# Patient Record
Sex: Male | Born: 1955 | Race: White | Hispanic: No | Marital: Married | Smoking: Never smoker
Health system: Southern US, Community
[De-identification: ages and names within clinical notes are randomized; demographics above are authoritative.]

## PROBLEM LIST (undated history)

## (undated) DIAGNOSIS — D689 Coagulation defect, unspecified: Secondary | ICD-10-CM

## (undated) DIAGNOSIS — I82409 Acute embolism and thrombosis of unspecified deep veins of unspecified lower extremity: Secondary | ICD-10-CM

## (undated) DIAGNOSIS — D6851 Activated protein C resistance: Secondary | ICD-10-CM

## (undated) DIAGNOSIS — K2210 Ulcer of esophagus without bleeding: Secondary | ICD-10-CM

## (undated) HISTORY — DX: Ulcer of esophagus without bleeding: K22.10

## (undated) HISTORY — DX: Activated protein C resistance: D68.51

## (undated) HISTORY — DX: Coagulation defect, unspecified: D68.9

## (undated) HISTORY — DX: Acute embolism and thrombosis of unspecified deep veins of unspecified lower extremity: I82.409

---

## 1979-08-13 HISTORY — PX: NASAL SINUS SURGERY: SHX719

## 2000-03-27 ENCOUNTER — Ambulatory Visit (HOSPITAL_BASED_OUTPATIENT_CLINIC_OR_DEPARTMENT_OTHER): Admission: RE | Admit: 2000-03-27 | Discharge: 2000-03-27 | Payer: Self-pay | Admitting: Urology

## 2004-06-02 ENCOUNTER — Encounter: Admission: RE | Admit: 2004-06-02 | Discharge: 2004-06-02 | Payer: Self-pay | Admitting: Specialist

## 2007-09-12 DIAGNOSIS — K2210 Ulcer of esophagus without bleeding: Secondary | ICD-10-CM

## 2007-09-12 DIAGNOSIS — D689 Coagulation defect, unspecified: Secondary | ICD-10-CM

## 2007-09-12 HISTORY — DX: Ulcer of esophagus without bleeding: K22.10

## 2007-09-12 HISTORY — DX: Coagulation defect, unspecified: D68.9

## 2007-10-01 ENCOUNTER — Emergency Department (HOSPITAL_COMMUNITY): Admission: EM | Admit: 2007-10-01 | Discharge: 2007-10-01 | Payer: Self-pay | Admitting: Emergency Medicine

## 2007-10-04 ENCOUNTER — Encounter (INDEPENDENT_AMBULATORY_CARE_PROVIDER_SITE_OTHER): Payer: Self-pay | Admitting: Internal Medicine

## 2007-10-05 ENCOUNTER — Inpatient Hospital Stay (HOSPITAL_COMMUNITY): Admission: EM | Admit: 2007-10-05 | Discharge: 2007-10-08 | Payer: Self-pay | Admitting: Emergency Medicine

## 2007-10-05 ENCOUNTER — Ambulatory Visit: Payer: Self-pay | Admitting: Internal Medicine

## 2007-10-05 ENCOUNTER — Encounter: Admission: RE | Admit: 2007-10-05 | Discharge: 2007-10-05 | Payer: Self-pay | Admitting: Specialist

## 2007-10-09 ENCOUNTER — Ambulatory Visit: Payer: Self-pay | Admitting: Hospitalist

## 2007-10-09 ENCOUNTER — Encounter (INDEPENDENT_AMBULATORY_CARE_PROVIDER_SITE_OTHER): Payer: Self-pay | Admitting: *Deleted

## 2007-10-09 DIAGNOSIS — I82409 Acute embolism and thrombosis of unspecified deep veins of unspecified lower extremity: Secondary | ICD-10-CM | POA: Insufficient documentation

## 2007-10-09 LAB — CONVERTED CEMR LAB
Basophils Relative: 1 % (ref 0–1)
Eosinophils Absolute: 0.3 10*3/uL (ref 0.0–0.7)
Eosinophils Relative: 5 % (ref 0–5)
HCT: 45.2 % (ref 39.0–52.0)
Lymphs Abs: 1.1 10*3/uL (ref 0.7–3.3)
MCHC: 33.8 g/dL (ref 30.0–36.0)
MCV: 88.7 fL (ref 78.0–100.0)
Monocytes Relative: 7 % (ref 3–11)
Neutrophils Relative %: 68 % (ref 43–77)
RBC: 5.1 M/uL (ref 4.22–5.81)
WBC: 5.7 10*3/uL (ref 4.0–10.5)

## 2007-10-12 ENCOUNTER — Ambulatory Visit: Payer: Self-pay | Admitting: Internal Medicine

## 2007-10-12 LAB — CONVERTED CEMR LAB: INR: 2.5

## 2007-10-18 ENCOUNTER — Ambulatory Visit: Payer: Self-pay | Admitting: *Deleted

## 2007-10-22 ENCOUNTER — Ambulatory Visit: Payer: Self-pay | Admitting: Internal Medicine

## 2007-10-22 DIAGNOSIS — K2210 Ulcer of esophagus without bleeding: Secondary | ICD-10-CM

## 2007-10-22 LAB — CONVERTED CEMR LAB: INR: 4.5

## 2007-10-29 ENCOUNTER — Ambulatory Visit: Payer: Self-pay | Admitting: *Deleted

## 2007-10-29 ENCOUNTER — Encounter: Payer: Self-pay | Admitting: Pharmacist

## 2007-10-29 LAB — CONVERTED CEMR LAB: INR: 4

## 2007-11-05 ENCOUNTER — Ambulatory Visit: Payer: Self-pay | Admitting: Internal Medicine

## 2007-11-05 LAB — CONVERTED CEMR LAB: INR: 2

## 2007-11-12 ENCOUNTER — Ambulatory Visit: Payer: Self-pay | Admitting: Hospitalist

## 2007-11-26 ENCOUNTER — Ambulatory Visit: Payer: Self-pay | Admitting: Internal Medicine

## 2007-12-11 ENCOUNTER — Ambulatory Visit: Payer: Self-pay | Admitting: Hospitalist

## 2007-12-11 LAB — CONVERTED CEMR LAB: INR: 5.2

## 2007-12-17 ENCOUNTER — Ambulatory Visit: Payer: Self-pay | Admitting: Internal Medicine

## 2007-12-17 LAB — CONVERTED CEMR LAB: INR: 2.5

## 2008-01-01 ENCOUNTER — Ambulatory Visit: Payer: Self-pay | Admitting: Internal Medicine

## 2008-01-01 LAB — CONVERTED CEMR LAB: INR: 2.7

## 2008-01-14 ENCOUNTER — Ambulatory Visit: Payer: Self-pay | Admitting: Hospitalist

## 2008-01-14 LAB — CONVERTED CEMR LAB

## 2008-02-25 ENCOUNTER — Ambulatory Visit: Payer: Self-pay | Admitting: Hospitalist

## 2008-02-25 LAB — CONVERTED CEMR LAB: INR: 2.8

## 2008-03-31 ENCOUNTER — Ambulatory Visit: Payer: Self-pay | Admitting: Hospitalist

## 2008-04-19 ENCOUNTER — Emergency Department (HOSPITAL_COMMUNITY): Admission: EM | Admit: 2008-04-19 | Discharge: 2008-04-19 | Payer: Self-pay | Admitting: Emergency Medicine

## 2008-05-09 ENCOUNTER — Encounter (INDEPENDENT_AMBULATORY_CARE_PROVIDER_SITE_OTHER): Payer: Self-pay | Admitting: *Deleted

## 2008-05-09 ENCOUNTER — Ambulatory Visit: Payer: Self-pay | Admitting: Vascular Surgery

## 2008-05-09 ENCOUNTER — Ambulatory Visit: Payer: Self-pay | Admitting: *Deleted

## 2008-05-09 ENCOUNTER — Ambulatory Visit (HOSPITAL_COMMUNITY): Admission: RE | Admit: 2008-05-09 | Discharge: 2008-05-09 | Payer: Self-pay | Admitting: *Deleted

## 2008-05-09 DIAGNOSIS — D6859 Other primary thrombophilia: Secondary | ICD-10-CM

## 2008-05-12 ENCOUNTER — Ambulatory Visit: Payer: Self-pay | Admitting: *Deleted

## 2008-05-12 LAB — CONVERTED CEMR LAB: INR: 2.1

## 2008-05-19 ENCOUNTER — Ambulatory Visit: Payer: Self-pay | Admitting: Internal Medicine

## 2008-05-19 LAB — CONVERTED CEMR LAB

## 2008-05-27 ENCOUNTER — Ambulatory Visit: Payer: Self-pay | Admitting: Internal Medicine

## 2008-05-27 LAB — CONVERTED CEMR LAB: INR: 6.1

## 2008-06-02 ENCOUNTER — Ambulatory Visit: Payer: Self-pay | Admitting: Internal Medicine

## 2008-06-17 ENCOUNTER — Ambulatory Visit: Payer: Self-pay | Admitting: *Deleted

## 2008-06-17 LAB — CONVERTED CEMR LAB

## 2008-07-07 ENCOUNTER — Ambulatory Visit: Payer: Self-pay | Admitting: Internal Medicine

## 2008-07-07 LAB — CONVERTED CEMR LAB: INR: 2.3

## 2008-08-04 ENCOUNTER — Ambulatory Visit: Payer: Self-pay | Admitting: Internal Medicine

## 2008-08-04 LAB — CONVERTED CEMR LAB: INR: 2.1

## 2008-09-01 ENCOUNTER — Ambulatory Visit: Payer: Self-pay | Admitting: Internal Medicine

## 2008-09-01 LAB — CONVERTED CEMR LAB: INR: 4.1

## 2008-10-06 ENCOUNTER — Ambulatory Visit: Payer: Self-pay | Admitting: Internal Medicine

## 2008-12-08 ENCOUNTER — Ambulatory Visit: Payer: Self-pay | Admitting: Infectious Diseases

## 2008-12-08 LAB — CONVERTED CEMR LAB: INR: 2.3

## 2009-02-10 ENCOUNTER — Ambulatory Visit: Payer: Self-pay | Admitting: Internal Medicine

## 2009-02-10 LAB — CONVERTED CEMR LAB

## 2009-03-09 ENCOUNTER — Ambulatory Visit: Payer: Self-pay | Admitting: Internal Medicine

## 2009-03-09 LAB — CONVERTED CEMR LAB: INR: 3.4

## 2009-03-20 ENCOUNTER — Telehealth: Payer: Self-pay | Admitting: Internal Medicine

## 2009-03-23 ENCOUNTER — Ambulatory Visit: Payer: Self-pay | Admitting: Internal Medicine

## 2009-03-23 LAB — CONVERTED CEMR LAB

## 2009-04-02 ENCOUNTER — Ambulatory Visit: Payer: Self-pay | Admitting: Internal Medicine

## 2009-04-03 ENCOUNTER — Encounter (INDEPENDENT_AMBULATORY_CARE_PROVIDER_SITE_OTHER): Payer: Self-pay | Admitting: Internal Medicine

## 2009-06-01 ENCOUNTER — Ambulatory Visit: Payer: Self-pay | Admitting: Internal Medicine

## 2009-06-01 LAB — CONVERTED CEMR LAB

## 2009-09-28 ENCOUNTER — Encounter: Payer: Self-pay | Admitting: Pharmacist

## 2009-09-28 ENCOUNTER — Ambulatory Visit: Payer: Self-pay | Admitting: Internal Medicine

## 2009-10-12 ENCOUNTER — Ambulatory Visit: Payer: Self-pay | Admitting: Internal Medicine

## 2009-10-12 LAB — CONVERTED CEMR LAB: INR: 2.9

## 2010-01-04 ENCOUNTER — Ambulatory Visit: Payer: Self-pay | Admitting: Internal Medicine

## 2010-01-04 LAB — CONVERTED CEMR LAB: INR: 1.9

## 2010-04-12 ENCOUNTER — Ambulatory Visit: Payer: Self-pay | Admitting: Internal Medicine

## 2010-04-12 ENCOUNTER — Encounter: Payer: Self-pay | Admitting: Pharmacist

## 2010-04-12 LAB — CONVERTED CEMR LAB: INR: 1.9

## 2010-07-13 ENCOUNTER — Encounter (INDEPENDENT_AMBULATORY_CARE_PROVIDER_SITE_OTHER): Payer: Self-pay | Admitting: Pharmacist

## 2010-07-13 ENCOUNTER — Ambulatory Visit: Payer: Self-pay | Admitting: Internal Medicine

## 2010-09-20 ENCOUNTER — Ambulatory Visit: Payer: Self-pay | Admitting: Internal Medicine

## 2010-11-24 ENCOUNTER — Ambulatory Visit: Payer: Self-pay | Admitting: Internal Medicine

## 2010-11-24 DIAGNOSIS — K219 Gastro-esophageal reflux disease without esophagitis: Secondary | ICD-10-CM | POA: Insufficient documentation

## 2010-11-24 LAB — CONVERTED CEMR LAB: INR: 4.2

## 2010-11-27 DIAGNOSIS — D6851 Activated protein C resistance: Secondary | ICD-10-CM | POA: Insufficient documentation

## 2010-11-27 DIAGNOSIS — K2210 Ulcer of esophagus without bleeding: Secondary | ICD-10-CM | POA: Insufficient documentation

## 2010-12-28 DIAGNOSIS — Z7901 Long term (current) use of anticoagulants: Secondary | ICD-10-CM | POA: Insufficient documentation

## 2010-12-28 DIAGNOSIS — D6859 Other primary thrombophilia: Secondary | ICD-10-CM

## 2010-12-28 DIAGNOSIS — I82409 Acute embolism and thrombosis of unspecified deep veins of unspecified lower extremity: Secondary | ICD-10-CM

## 2011-01-11 NOTE — Assessment & Plan Note (Signed)
Summary: COUM/SB.  Anticoagulant Therapy Managed by: Barbera Setters. Janie Morning  PharmD CACP OPC Attending: Coralee Pesa MD, Joen Laura  Indication 1: Deep vein thrombus Indication 2: Encounter for therapeutic drug monitoring  V58.83  Patient Assessment Reviewed by: Chancy Milroy PharmD  Apr 12, 2010 Medication review: verified warfarin dosage & schedule,verified previous prescription medications, verified doses & any changes, verified new medications, reviewed OTC medications, reviewed OTC health products-vitamins supplements etc Complications: none Dietary changes: none   Health status changes: none   Lifestyle changes: none   Recent/future hospitalizations: none   Recent/future procedures: none   Recent/future dental: none Patient Assessment Part 2:  Have you MISSED ANY DOSES or CHANGED TABLETS?  No missed Warfarin doses or changed tablets.  Have you had any BRUISING or BLEEDING ( nose or gum bleeds,blood in urine or stool)?  No reported bruising or bleeding in nose, gums, urine, stool.  Have you STARTED or STOPPED any MEDICATIONS, including OTC meds,herbals or supplements?  No other medications or herbal supplements were started or stopped.  Have you CHANGED your DIET, especially green vegetables,or ALCOHOL intake?  No changes in diet or alcohol intake.  Have you had any ILLNESSES or HOSPITALIZATIONS?  No reported illnesses or hospitalizations  Have you had any signs of CLOTTING?(chest discomfort,dizziness,shortness of breath,arms tingling,slurred speech,swelling or redness in leg)    No chest discomfort, dizziness, shortness of breath, tingling in arm, slurred speech, swelling, or redness in leg.     Treatment  Target INR: 2.0-3.0 INR: 1.9  Date: 04/12/2010 Regimen In:  27.5mg /week INR reflects regimen in: 1.9  New  Tablet strength: : 5mg  Regimen Out:     Sunday: 1 Tablet     Monday: 1 Tablet     Tuesday: 1 Tablet     Wednesday: 1/2 Tablet     Thursday: 1 Tablet      Friday: 1  Tablet     Saturday: 1 Tablet Total Weekly: 32.5mg/week mg  Next INR Due: 05/17/2010 Adjusted by: James B. Groce III PharmD CACP   Return to anticoagulation clinic:  05/17/2010 Time of next visit: 0845    Allergies: No Known Drug Allergies Prescriptions: WARFARIN SODIUM 5 MG TABS (WARFARIN SODIUM) Tablet Strength: 5mg Take as directed  #50 x 1   Entered by:   Jay Groce PharmD   Authorized by:   Wynne E Woodyear MD   Signed by:   Jay Groce PharmD on 04/12/2010   Method used:   Electronically to        [facility]  [address]  [id number] (retail)       [address]       [place], [place]  [address]       Ph: [phone] or [phone]       Fax: [phone]   RxID:   [id number]

## 2011-01-11 NOTE — Assessment & Plan Note (Signed)
Summary: 261/CFB  Anticoagulant Therapy Managed by: Barbera Setters. Janie Morning  PharmD CACP OPC Attending: Coralee Pesa MD, Levada Schilling Indication 1: Deep vein thrombus Indication 2: Encounter for therapeutic drug monitoring  V58.83  Patient Assessment Reviewed by: Chancy Milroy PharmD  September 20, 2010 Medication review: verified warfarin dosage & schedule,verified previous prescription medications, verified doses & any changes, verified new medications, reviewed OTC medications, reviewed OTC health products-vitamins supplements etc Complications: none Dietary changes: none   Health status changes: none   Lifestyle changes: none   Recent/future hospitalizations: none   Recent/future procedures: none   Recent/future dental: none Patient Assessment Part 2:  Have you MISSED ANY DOSES or CHANGED TABLETS?  No missed Warfarin doses or changed tablets.  Have you had any BRUISING or BLEEDING ( nose or gum bleeds,blood in urine or stool)?  No reported bruising or bleeding in nose, gums, urine, stool.  Have you STARTED or STOPPED any MEDICATIONS, including OTC meds,herbals or supplements?  No other medications or herbal supplements were started or stopped.  Have you CHANGED your DIET, especially green vegetables,or ALCOHOL intake?  No changes in diet or alcohol intake.  Have you had any ILLNESSES or HOSPITALIZATIONS?  No reported illnesses or hospitalizations  Have you had any signs of CLOTTING?(chest discomfort,dizziness,shortness of breath,arms tingling,slurred speech,swelling or redness in leg)    No chest discomfort, dizziness, shortness of breath, tingling in arm, slurred speech, swelling, or redness in leg.     Treatment  Target INR: 2.0-3.0 INR: 1.5  Date: 09/20/2010 Regimen In:  32.5mg /week INR reflects regimen in: 1.5  New  Tablet strength: : 5mg  Regimen Out:     Sunday: 1 Tablet     Monday: 1 & 1/2 Tablet     Tuesday: 1 Tablet     Wednesday: 1 Tablet     Thursday: 1 & 1/2 Tablet     Friday: 1 Tablet     Saturday: 1 Tablet Total Weekly: 40.0mg /week mg  Next INR Due: 10/04/2010 Adjusted by: Barbera Setters. Alexandria Lodge III PharmD CACP   Return to anticoagulation clinic:  10/04/2010 Time of next visit: 0915    Allergies: No Known Drug Allergies

## 2011-01-11 NOTE — Miscellaneous (Signed)
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 Counadin for life. I have ordered an INR and I will F/U the results. Will send note to Dr Alexandria Lodge to arrange F/U.  INR 2.7. No change to his coumadin regimen. 5 mg tab, one once daily except Wed when he takes 1/2 tab, total 32.5 weekly.   Orders: Added new Test order of T-Protime (in-house) [phone]) - Signed Observations: Added new observation of INR: 2.7  (07/13/2010 9:07)      Laboratory Results   Blood Tests   Date/Time Received: July 13, 2010 9:20 AM Date/Time Reported: Burke Keels  July 13, 2010 9:20 AM    INR: 2.7   (Normal Range: 0.88-1.12   Therap INR: 2.0-3.5)

## 2011-01-11 NOTE — Assessment & Plan Note (Signed)
Summary: COU/CH  Anticoagulant Therapy Managed by: Barbera Setters. Alexandria Lodge III  PharmD CACP OPC Attending: Rogelia Boga MD, Lanora Manis Indication 1: Deep vein thrombus Indication 2: Encounter for therapeutic drug monitoring  V58.83  Patient Assessment Reviewed by: Chancy Milroy PharmD  January 04, 2010 Medication review: verified warfarin dosage & schedule,verified previous prescription medications, verified doses & any changes, verified new medications, reviewed OTC medications, reviewed OTC health products-vitamins supplements etc Complications: none Dietary changes: none   Health status changes: none   Lifestyle changes: none   Recent/future hospitalizations: none   Recent/future procedures: none   Recent/future dental: none Patient Assessment Part 2:  Have you MISSED ANY DOSES or CHANGED TABLETS?  No missed Warfarin doses or changed tablets.  Have you had any BRUISING or BLEEDING ( nose or gum bleeds,blood in urine or stool)?  No reported bruising or bleeding in nose, gums, urine, stool.  Have you STARTED or STOPPED any MEDICATIONS, including OTC meds,herbals or supplements?  No other medications or herbal supplements were started or stopped.  Have you CHANGED your DIET, especially green vegetables,or ALCOHOL intake?  No changes in diet or alcohol intake.  Have you had any ILLNESSES or HOSPITALIZATIONS?  No reported illnesses or hospitalizations  Have you had any signs of CLOTTING?(chest discomfort,dizziness,shortness of breath,arms tingling,slurred speech,swelling or redness in leg)    No chest discomfort, dizziness, shortness of breath, tingling in arm, slurred speech, swelling, or redness in leg.     Treatment  Target INR: 2.0-3.0 INR: 1.9  Date: 01/04/2010 Regimen In:  25mg /wk INR reflects regimen in: 1.9  New  Tablet strength: : 5mg  Regimen Out:     Sunday: 1/2 Tablet     Monday: 1 Tablet     Tuesday: 1 Tablet     Wednesday: 1 Tablet     Thursday: 1/2 Tablet      Friday:  1 Tablet     Saturday: 1/2 Tablet Total Weekly: 27.5mg /week mg  Next INR Due: 02/01/2010 Adjusted by: Barbera Setters. Alexandria Lodge III PharmD CACP   Return to anticoagulation clinic:  02/01/2010 Time of next visit: 0900    Allergies: No Known Drug Allergies

## 2011-01-11 NOTE — Assessment & Plan Note (Signed)
Summary: [facility]in Clinic  Anticoagulant Therapy Managed by: Barbera Setters. Robert [name]  PharmD CACP OPC Attending: Coralee Pesa MD, Joen Laura  Indication 1: Deep vein thrombus Indication 2: Encounter for therapeutic drug monitoring  V58.83  Patient Assessment Reviewed by: Chancy Milroy PharmD  Apr 12, 2010 Medication review: verified warfarin dosage & schedule,verified previous prescription medications, verified doses & any changes, verified new medications, reviewed OTC medications, reviewed OTC health products-vitamins supplements etc Complications: none Dietary changes: none   Health status changes: none   Lifestyle changes: none   Recent/future hospitalizations: none   Recent/future procedures: none   Recent/future dental: none Patient Assessment Part 2:  Have you MISSED ANY DOSES or CHANGED TABLETS?  No missed Warfarin doses or changed tablets.  Have you had any BRUISING or BLEEDING ( nose or gum bleeds,blood in urine or stool)?  No reported bruising or bleeding in nose, gums, urine, stool.  Have you STARTED or STOPPED any MEDICATIONS, including OTC meds,herbals or supplements?  No other medications or herbal supplements were started or stopped.  Have you CHANGED your DIET, especially green vegetables,or ALCOHOL intake?  No changes in diet or alcohol intake.  Have you had any ILLNESSES or HOSPITALIZATIONS?  No reported illnesses or hospitalizations  Have you had any signs of CLOTTING?(chest discomfort,dizziness,shortness of breath,arms tingling,slurred speech,swelling or redness in leg)    No chest discomfort, dizziness, shortness of breath, tingling in arm, slurred speech, swelling, or redness in leg.     Treatment  Target INR: 2.0-3.0 INR: 1.9  Date: 04/12/2010 Regimen In:  32.5mg /week INR reflects regimen in: 1.9  New  Tablet strength: : 5mg  Regimen Out:     Sunday: 1 Tablet     Monday: 1 Tablet     Tuesday: 1 Tablet     Wednesday: 1/2 Tablet     Thursday: 1 Tablet  Friday: 1 Tablet     Saturday: 1 Tablet Total Weekly: 32.5mg /week mg  Next INR Due: 05/17/2010 Adjusted by: Barbera Setters. Alexandria Lodge III PharmD CACP   Return to anticoagulation clinic:  05/17/2010 Time of next visit: 0845    Allergies: No Known Drug Allergies

## 2011-01-13 NOTE — Assessment & Plan Note (Signed)
Summary: CHECKUP/NEED MEDICATION/SB.   Vital Signs:  Patient profile:   55 year old male Height:      75 inches (190.50 cm) Weight:      197.01 pounds (89.55 kg) BMI:     24.71 Temp:     97.2 degrees F (36.22 degrees C) oral Pulse rate:   66 / minute BP sitting:   117 / 66  (right arm)  Vitals Entered By: Angelina Ok RN (November 24, 2010 2:04 PM) Is Patient Diabetic? No Pain Assessment Patient in pain? no      Nutritional Status BMI of 19 -24 = normal  Have you ever been in a relationship where you felt threatened, hurt or afraid?No   Does patient need assistance? Functional Status Self care Ambulation Normal Comments Check up.   History of Present Illness: This is a 55 year old male with hx of GERD and factor 5 leiden sp deep vein thrombophlebitis and currently on coumadin  who presents for routine follow up.   Pt actually has plans to change his PCP over to Dr. Tenny Craw in the near future but just wanted to come in today for a refill of his coumadin.  Pts last blood clot was 2-3 years ago per him and he is on lifelong coumadin.  Pt is living a healthy lifestyle, is working out, and eating healthy.  Pt denies any GERD symptoms, CP, SOB, change in weight, fever or chills.    Depression History:      The patient denies a depressed mood most of the day and a diminished interest in his usual daily activities.         Preventive Screening-Counseling & Management  Alcohol-Tobacco     Alcohol drinks/day: 0     Smoking Status: never     Passive Smoke Exposure: no  Allergies: No Known Drug Allergies  Past History:  Past Medical History: Last updated: 05/09/2008 DVT left leg 10-08, Coumadin for 6 months Heterozygosity for factor V Leiden mutation Hx of esophageal ulcer 10-08  Family History: Reviewed history from 04/02/2009 and no changes required. mother with strokes in her 22's father with CABG in his 73's  Social History: Reviewed history from 04/02/2009 and no  changes required. 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   Physical Exam  General:  alert and well-developed.   Head:  normocephalic and atraumatic.   Eyes:  vision grossly intact, pupils equal, pupils round, and pupils reactive to light.   Nose:  no nasal discharge.   Mouth:  pharynx pink and moist.   Lungs:  normal respiratory effort, normal breath sounds, no crackles, and no wheezes.   Heart:  normal rate, regular rhythm, no murmur, no gallop, and no rub.   Abdomen:  soft, non-tender, and normal bowel sounds.   Msk:  normal ROM.   Pulses:  2+ dp/pt pulses.  Extremities:  No edema.    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 Per Dr. Claris Che last note, pt is heterozygous for factor 5 Leiden deficiency. I will refill pts coumadin today.  Pts INR today was 4.2 as a result I asked that he hold today's dose and resume his normal dose tomorrow.  I have requested that Dr. Alexandria Lodge provide further recommendations if needed.  Pt currently denies any unusual or easy bleeding.  I  did inform the pt that he needed to discuss changing PCP's with Dr. Alexandria Lodge to figure out who will be managing pts coumadin in the future.  Orders: T-Protime (in-house) [phone])  Problem # 2:  GERD (ICD-530.81) Pt denies any GERD symptoms and has been taking his nexium regularly.   His updated medication list for this problem includes:    Nexium 40 Mg Cpdr (Esomeprazole magnesium) .Marland Kitchen... Take 1 capsule by mouth once a day  Problem # 3:  Preventive Health Care (ICD-V70.0) Pt up to date on his flu shot, tetnus shot and colonoscopy.  Pt still needs a FLP but wants Dr. Tenny Craw to do it.  We will transfer the patients records at his future request.   Complete Medication List: 1)  Nexium 40 Mg Cpdr (Esomeprazole magnesium) .... Take 1 capsule by mouth once a  day 2)  Warfarin Sodium 5 Mg Tabs (Warfarin sodium) .... Tablet strength: 5mg  take as directed 3)  Zyrtec Allergy 10 Mg Tabs (Cetirizine hcl)  Patient Instructions: 1)  Please tell your new doctor that you need a fasting lipid panel. Please discuss changing your primary doctor with Dr. Alexandria Lodge to make sure he will still be able to manage your coumadin.  Prescriptions: WARFARIN SODIUM 5 MG TABS (WARFARIN SODIUM) Tablet Strength: 5mg  Take as directed  #100 Tablet x 2   Entered and Authorized by:   Sinda Du MD   Signed by:   Sinda Du MD on 11/24/2010   Method used:   Print then Give to Patient   RxID:   [id number]    Orders Added: 1)  Est. Patient Level III [62130] 2)  T-Protime (in-house) [86578IO]     Vital Signs:  Patient profile:   55 year old male Height:      75 inches (190.50 cm) Weight:      197.01 pounds (89.55 kg) BMI:     24.71 Temp:     97.2 degrees F (36.22 degrees C) oral Pulse rate:   66 / minute BP sitting:   117 / 66  (right arm)  Vitals Entered By: Angelina Ok RN (November 24, 2010 2:04 PM)   Prevention & Chronic Care Immunizations   Influenza vaccine: Not documented    Tetanus booster: Not documented    Pneumococcal vaccine: Not documented  Colorectal Screening   Hemoccult: Not documented    Colonoscopy: Not documented  Other Screening   PSA: Not documented   Smoking status: never  (11/24/2010)  Lipids   Total Cholesterol: Not documented   LDL: Not documented   LDL Direct: Not documented   HDL: Not documented   Triglycerides: Not documented  Laboratory Results   Blood Tests   Date/Time Received: November 24, 2010 4:41 PM Date/Time Reported: Alric Quan  November 24, 2010 4:41 PM    INR: 4.2   (Normal Range: 0.88-1.12   Therap INR: 2.0-3.5) Comments: Results reported to Dr Cathey Endow by Levy Sjogren, PBT 11-24-10 $ 1643p  Alric Quan  November 24, 2010 4:44 PM

## 2011-02-05 ENCOUNTER — Observation Stay (HOSPITAL_COMMUNITY)
Admission: EM | Admit: 2011-02-05 | Discharge: 2011-02-06 | Disposition: A | Discharge status: Home / Self Care | Payer: 59 | Attending: Family Medicine | Admitting: Family Medicine

## 2011-02-05 ENCOUNTER — Emergency Department (HOSPITAL_COMMUNITY): Payer: 59

## 2011-02-05 DIAGNOSIS — Z86718 Personal history of other venous thrombosis and embolism: Secondary | ICD-10-CM | POA: Insufficient documentation

## 2011-02-05 DIAGNOSIS — M7981 Nontraumatic hematoma of soft tissue: Principal | ICD-10-CM | POA: Insufficient documentation

## 2011-02-05 DIAGNOSIS — Z7901 Long term (current) use of anticoagulants: Secondary | ICD-10-CM | POA: Insufficient documentation

## 2011-02-05 DIAGNOSIS — T45515A Adverse effect of anticoagulants, initial encounter: Secondary | ICD-10-CM | POA: Insufficient documentation

## 2011-02-05 DIAGNOSIS — R791 Abnormal coagulation profile: Secondary | ICD-10-CM | POA: Insufficient documentation

## 2011-02-05 DIAGNOSIS — R252 Cramp and spasm: Secondary | ICD-10-CM | POA: Insufficient documentation

## 2011-02-05 DIAGNOSIS — K219 Gastro-esophageal reflux disease without esophagitis: Secondary | ICD-10-CM | POA: Insufficient documentation

## 2011-02-05 LAB — TYPE AND SCREEN
ABO/RH(D): O POS
Antibody Screen: NEGATIVE

## 2011-02-05 LAB — POCT I-STAT, CHEM 8
BUN: 24 mg/dL — ABNORMAL HIGH (ref 6–23)
Chloride: 102 mEq/L (ref 96–112)
Sodium: 138 mEq/L (ref 135–145)

## 2011-02-05 LAB — CBC
HCT: 42 % (ref 39.0–52.0)
Hemoglobin: 14.4 g/dL (ref 13.0–17.0)
MCH: 29.7 pg (ref 26.0–34.0)
MCHC: 34.3 g/dL (ref 30.0–36.0)

## 2011-02-05 LAB — DIFFERENTIAL
Lymphocytes Relative: 20 % (ref 12–46)
Lymphs Abs: 1.6 10*3/uL (ref 0.7–4.0)
Monocytes Relative: 7 % (ref 3–12)
Neutro Abs: 5.8 10*3/uL (ref 1.7–7.7)
Neutrophils Relative %: 72 % (ref 43–77)

## 2011-02-05 LAB — PROTIME-INR: INR: 1.96 — ABNORMAL HIGH (ref 0.00–1.49)

## 2011-02-06 LAB — BASIC METABOLIC PANEL
BUN: 11 mg/dL (ref 6–23)
CO2: 28 mEq/L (ref 19–32)
Calcium: 8.9 mg/dL (ref 8.4–10.5)
Glucose, Bld: 99 mg/dL (ref 70–99)
Sodium: 141 mEq/L (ref 135–145)

## 2011-02-06 LAB — PREPARE FRESH FROZEN PLASMA: Unit division: 0

## 2011-02-06 LAB — CBC
HCT: 40.9 % (ref 39.0–52.0)
Hemoglobin: 13.6 g/dL (ref 13.0–17.0)
MCH: 29.2 pg (ref 26.0–34.0)
MCHC: 33.3 g/dL (ref 30.0–36.0)
MCV: 87.8 fL (ref 78.0–100.0)
Platelets: 139 10*3/uL — ABNORMAL LOW (ref 150–400)
RBC: 4.66 MIL/uL (ref 4.22–5.81)
RDW: 14.3 % (ref 11.5–15.5)
WBC: 6.8 10*3/uL (ref 4.0–10.5)

## 2011-02-14 NOTE — H&P (Signed)
Robert [name], Robert [name]               ACCOUNT NO.:  [id number]  MEDICAL RECORD NO.:  [id number]           PATIENT TYPE:  E  LOCATION:  [facility]                         FACILITY:  [facility]  PHYSICIAN:  Robert [name], M.D. DATE OF BIRTH:  [birth year]  DATE OF ADMISSION:  02/05/2011 DATE OF DISCHARGE:                             HISTORY & PHYSICAL   PRIMARY CARE PHYSICIAN:  Dr. Dorma Russell  CHIEF COMPLAINT:  Increased pain and swelling, left thigh.  HISTORY OF PRESENT ILLNESS:  This is a 55 year old male who is on chronic Coumadin therapy secondary to recurrent deep venous thrombosis who presents to the Emergency Department with complaints of increased pain and swelling of the left thigh area.  The onset occurred in the afternoon on the day prior to admission.  The patient contacted his primary care physician who was concerned about bleeding through the thigh.  The patient denied having any trauma.  He does exercise and do jujitsu, but reports no contact or trauma.  He presented to the Emergency Department and was evaluated.  His Coumadin level was found to be mildly supratherapeutic with an INR of 3.48.  He had the CT scan of the left hip and thigh performed, which revealed expansion of the deep anterior muscle groups in the left thigh encompassing an area of 8 x 16 x 6, low density areas within the irregularly-shaped expansion consistent with a hematoma if the history of the patient was coagulopathic.  There had been a history of trauma.  The Emergency Department physician contacted orthopedics for a consultation to evaluate for possible compartment syndrome and the consultants opinion was that this was not a compartment syndrome.  Advice was given to reverse the patient's Coumadin level of which the Emergency Department physician began FFP and administered IV vitamin K x1 dose.  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  PAST SURGICAL HISTORY:  History of a sinus surgery.  MEDICATIONS:  Coumadin and hydrocodone/APAP.  SOCIAL HISTORY:  The patient is a nonsmoker, nondrinker.  No history of illicit drug usage.  FAMILY HISTORY:  Negative for coagulopathic disorders.  REVIEW OF SYSTEMS:  The patient denies having any fever, chills, nausea, vomiting, diarrhea, hematemesis, hematochezia, or melena passage. Denies having any bleeding gums.  The patient denies having any lightheadedness, syncope, weakness, or fatigue.  He denies having any changes in his sleep habits.  Denies having any other muscular pain other than at the site of the hematoma.  PHYSICAL EXAMINATION FINDINGS:  GENERAL:  This is a well-nourished, well- developed 55 year old Caucasian male who is in discomfort, but no acute distress. VITAL SIGNS:  Temperature 97.9, blood pressure 132/86, heart rate 65, respirations 14, and O2 sats 100%. HEENT:  Normocephalic, atraumatic.  Pupils are equally round, reactive to light.  Extraocular movements are intact.  Funduscopic benign.  There is no scleral icterus.  Nares are patent bilaterally.  Oropharynx is clear. NECK:  Supple.  Full range of motion.  No thyromegaly, adenopathy, or jugular venous distention. CARDIOVASCULAR:  Regular rate and rhythm.  No murmurs, gallops,  or rubs appreciated. LUNGS:  Clear to auscultation bilaterally.  No rales, rhonchi, or wheezes. ABDOMEN:  Positive bowel sounds.  Soft, nontender, nondistended.  No hepatosplenomegaly. EXTREMITIES:  Without cyanosis, clubbing, or edema. NEUROLOGIC:  Nonfocal.  LABORATORY DATA:  White blood cell count 8.1, hemoglobin 14.4, hematocrit 42.0, MCV 86.6, platelets 158, neutrophils 72%, and lymphocytes 20%.  Sodium 138, potassium 3.8, chloride 102, CO2 of 24, BUN 24, creatinine 1.2, and glucose 110.  Protime 35.0 and INR 3.48.  IMAGING DATA:  Results of the CT scan of the left hip and thigh  area as mentioned above in the HPI.  ASSESSMENT:  A 55 year old male being admitted with, 1. Coagulopathy secondary to Coumadin therapy with supratherapeutic     level. 2. Left thigh hematoma secondary coagulopathy. 3. History of recurrent deep venous thrombosis. 4. Left thigh pain.  PLAN:  The patient will be admitted and the patient has been administered vitamin K IV x1 dose and FFP x1 unit to reverse his Coumadin level.  His protime and INR will be monitored daily.  Pain control therapy will be initiated.  The orthopedic consultation has been performed and the written note has been placed in the chart.  The patient will be placed on pain control therapy as needed and the PT and INR will be monitored daily.     Robert [name], M.D.     HJ/MEDQ  D:  02/05/2011  T:  02/05/2011  Job:  [id number]  cc:   Dorma Russell  Electronically Signed by Robert [name] M.D. on 02/14/2011 08:23:51 PM

## 2011-02-24 NOTE — Discharge Summary (Signed)
  Robert [name], Robert [name]               ACCOUNT NO.:  [id number]  MEDICAL RECORD NO.:  [id number]           PATIENT TYPE:  I  LOCATION:  5032                         FACILITY:  [facility]  PHYSICIAN:  Pleas Koch, MD        DATE OF BIRTH:  [birth year]  DATE OF ADMISSION:  02/05/2011 DATE OF DISCHARGE:  02/06/2011                              DISCHARGE SUMMARY   PRIMARY CARE PHYSICIAN:  C. Duane Lope, MD  DISCHARGE DIAGNOSES: 1. Left thigh hematoma secondary to coagulopathy. 2. Supratherapeutic INR secondary to being on Coumadin therapy. 3. History of recurrent left deep venous thrombosis.  Discharge medications are as follows: 1. Coumadin 5 mg p.o. nightly. 2. Roxicodone 5 mg p.o. q.6 h. p.r.n. for 5 days, quantity sufficient     for that (20). 3. Nexium 40 mg one capsule every morning.  PERTINENT CONSULTS:  Dr. Malon Kindle of Orthopedics saw the patient.  PERTINENT IMAGING:  CT of the left hip area showed, 1. Abnormally expanded heterogeneous deep muscle group of the left     thigh encompassing an area of 8 x 16 x 6.  If the patient is     significantly coagulopathic or has traumatized this region,     hematoma is most likely.  No bony changes in the visualized left     lower extremity, visualized pelvic viscera unremarkable.   Dictation ended at this point.          ______________________________ Pleas Koch, MD     JS/MEDQ  D:  02/06/2011  T:  02/06/2011  Job:  [id number]  cc:   Almedia Balls. Ranell Patrick, M.D. Vianne Bulls, M.D.  Electronically Signed by Pleas Koch MD on 02/24/2011 04:36:40 PM

## 2011-02-24 NOTE — Discharge Summary (Signed)
Robert [name], Robert [name]               ACCOUNT NO.:  [id number]  MEDICAL RECORD NO.:  [id number]           PATIENT TYPE:  I  LOCATION:  5032                         FACILITY:  [facility]  PHYSICIAN:  [facility]      DATE OF BIRTH:  Oct 29, 1956  DATE OF ADMISSION:  02/05/2011 DATE OF DISCHARGE:  02/06/2011                              DISCHARGE SUMMARY   PRIMARY CARE PHYSICIAN:  Dr. Tenny Craw and Dr. Almedia Balls. Ranell Patrick, MD  DISCHARGE DIAGNOSES: 1. Left thigh hematoma. 2. Coagulopathy secondary to supratherapeutic INR. 3. 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 4. Reflux.  DISCHARGE MEDICATIONS: 1. Coumadin 5 mg 1 tablet daily at bedtime. 2. Nexium 40 mg 1 capsule every morning. 3. OxyIR 5 mg q.6 h. as needed, take 5 days.  The patient was given a     prescription for 20 tablets.  PERTINENT CONSULTS:  Almedia Balls. Ranell Patrick, MD, ortho  PERTINENT STUDIES: 1. CT of the left hip without contrast February 05, 2011, showed no     collection, but heterogenous deep muscle group left leg 8 x     16 x 6 cm.  The patient denies significant     traumatized to his recent physical acgtivity and hematomas, most likely. 2. No bony changes in the visualized left lower extremity and     visualized pelvic viscera unremarkable.  BRIEF HISTORY:  The patient is 55 year old male on chronic Coumadin therapy secondary to recurrent DVT, most recently in 2008, complaints of increased thigh swelling and pain.  This occurred  prior to admission.  He contacted PCP with concern about bleeding through the thigh.  The patient does exercise, but reports no contact to trauma.  He was found to be mildly supratherapeutic with an INR of 3.48 initially and CT as above.  Orthopedics saw the patient and Dr. Ranell Patrick was seen for possible compartment syndrome.  The patient's INR was reversed with FFP and 1 dose of vitamin K.  HOSPITAL COURSE:  The patient's INR trended down and the swelling trended down nicely while in  hospital.  His discharge INR was 1.27.  The patient was ambulant subsequent to this and on the day of discharge was actually ambulating well, tolerating full diet, and although he had pain, was able to get around.  I have instructed him to followup with his primary care physician Dr. Tenny Craw for further instructions and Coumadin dosing in the next one to next 4-5 days.  He is to restart his Coumadin that has been held overnight while he was here in the hospital at his regular home dosage.  Labs done showed slight drop in hemoglobin 14.6 and 13.6; however, I do not deem this as something that is detrimental to him at this point in time.  On the day of discharge, discharge vitals 98, pulse is 65, blood pressure is 112-127/61-73, and he is satting 95% on room air.    ______________________________ Pleas Koch, MD   ______________________________ [facility]    JS/MEDQ  D:  02/06/2011  T:  02/06/2011  Job:  [id number]  cc:   Dr. Baruch Gouty R. Ranell Patrick, M.D.  Electronically Signed by Pleas Koch MD on 02/24/2011 04:36:30 PM

## 2011-02-26 NOTE — Consult Note (Signed)
NAMERAMESES, OU               ACCOUNT NO.:  [id number]  MEDICAL RECORD NO.:  [id number]           PATIENT TYPE:  I  LOCATION:  5032                         FACILITY:  [facility]  PHYSICIAN:  Almedia Balls. Ranell Patrick, M.D. DATE OF BIRTH:  Apr 11, 1956  DATE OF CONSULTATION:  02/05/2011 DATE OF DISCHARGE:  02/06/2011                                CONSULTATION   REASON FOR CONSULTATION:  Rule out compartment syndrome.  HISTORY OF PRESENT ILLNESS:  The patient is a 55 year old male with a history of prior deep venous thrombosis x2, currently anticoagulated who has a several hour history of increasing left proximal thigh swelling and pain.  The patient reports normally being followed for his INR fairly closely, around 2.5 is his INR level.  The patient was recently seen in Dr. Charlott Rakes office yesterday, February 04, 2011 and had an INR level of 4.1.  The patient does a lot of Jiu-Jitsu, a martial arts which is primarily grabbling and wrestling and he was doing some of that but denied any injury prior to thigh beginning to swell.  The patient reported about a 4-hour time difference between the time that he finished his Jiu-Jitsu work out and began having thigh swelling.  The patient is in quite a bit of pain now and was asked by Dr. Randa Evens in the emergency department to evaluate to rule out compartment syndrome.  PAST HISTORY:  DVT x2 in the left lower extremity, the last one in 2008 with chronic anticoagulation followed by Dr. Charlott Rakes office.  Other past medical history is for esophageal ulcer and allergies.  MEDICATIONS:  Coumadin.  PHYSICAL EXAMINATION:  GENERAL:  The patient is a healthy appearing male who is in moderate to severe distress, alert and oriented. EXTREMITIES:  Examination of the left lower extremity reveals a focal area of swelling in the left proximal thigh, it is very tender to palpation.  Compartment was compressible and fairly soft.  There is no pain with palpation distal  in the compartment which is primarily in the anterior compartment of the thigh.  He has limited ability to extend his knee secondary to pain and we did not really do any flexion of the knee but the knee itself was not swollen, not red.  There was no posterior tenderness.  He has 2+ pulses distally at the posterior tib pulses and sensation is normal distally and the leg appears well perfused.  Radiographs are negative.  IMPRESSION:  Left thigh bleed secondary to supratherapeutic Coumadin level.  PLAN:  Discussed with Mr. Rowe that definitely we are going to minutes recommend trying to get his INR level back down to around 2.5 and I am sure this is what is responsible for the bleeding in the thigh. Certainly knows he is at risk doing a contact sports such as Jiu-Jitsu for generating bleeding in areas including joints, hand, and muscle areas.  At this point would recommend mechanical DVT prophylaxis, reversal or correction of INR to 2.5. Supportive care with pain medications and bed rest until swelling stops and INR normalizes and we are happy to come back and reevaluate the patient should there be  any more concern for compartment syndrome but right now there are no indications that he has compartment syndrome and simply has a localized swelling to the thigh.     Almedia Balls. Ranell Patrick, M.D.     SRN/MEDQ  D:  02/10/2011  T:  02/11/2011  Job:  [id number]  Electronically Signed by Malon Kindle  on 02/26/2011 11:22:11 AM

## 2011-04-26 NOTE — Discharge Summary (Signed)
Robert [name], Robert [name]               ACCOUNT NO.:  [id number]   MEDICAL RECORD NO.:  [id number]          PATIENT TYPE:  INP   LOCATION:  5734                         FACILITY:  [facility]   PHYSICIAN:  Madaline Guthrie, M.D.    DATE OF BIRTH:  Dec 26, 1955   DATE OF ADMISSION:  10/05/2007  DATE OF DISCHARGE:  10/06/2007                               DISCHARGE SUMMARY   DISCHARGE DIAGNOSES:  1. New deep venous thrombosis.  2. Esophageal ulcer diagnosed by Dr. Loreta Ave as an outpatient on October 03, 2007.  3. Previous sinus surgery.  4. Allergic rhinitis.   DISCHARGE MEDICATIONS:  1. Nexium 40 mg daily.  2. Sucralfate 4 times a day.  3. Lovenox 80 mg subcutaneous twice a day.  4. Coumadin 5 mg daily.   DISPOSITION AT DISCHARGE:  The patient was shown how to inject himself  with the Lovenox and will continue on Coumadin 5 mg daily until he can  come and get his INR checked in the outpatient clinic where he will try  to establish primary care.  He was advised to come to the outpatient  clinic on Tuesday, October 09, 2007, for an INR check and the results  will be called to Dr. Dellia Beckwith for Coumadin adjustment.  He will  also be scheduled for a visit with a physician after the weekend when  the clinic opens and will be called with that appointment.   At the time of discharge, a hypercoagulable panel was still pending and  needs to be followed as an outpatient and, at his follow up in the  clinic, we also need to get records from Dr. Loreta Ave to see if she did a  biopsy of that esophageal ulcer to see if this malignancy or not.   PROCEDURES:  Left leg venous Doppler performed at [facility]  earlier in the day showed occlusive thrombus extending from the  posterior tibial veins in the mid calf through the popliteal vein with  nonocclusive thrombus extending the superficial femoral vein.  The  common femoral and deep femoral veins remained widely patent.   CONSULTATIONS:   None.   ADMITTING HISTORY AND PHYSICAL:  [name] is a 55 year old white man  with a previous history of recently diagnosed esophageal ulcer on  October 03, 2007, that was diagnosed after an episode of choking as well  as allergic rhinitis who came in with a 3-week history of left leg pain  and swelling that was getting progressively worse.  He was sent to  [facility] earlier the morning of admission for venous Doppler  and was sent to the emergency room after he was told that it was  positive for a DVT.  He denied any chest pain, hemoptysis, falls or  difficulty breathing.  He denies any previous history of blood clots,  either personal or in his family.  He has never smoked.  Does not drink.  Does not have any known cancer or any signs pointing to any cancer like  weight loss or night sweats, although he was recently diagnosed with an  esophageal ulcer.  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   PHYSICAL EXAMINATION:  VITAL SIGNS:  Blood pressure 121/75, heart rate  54, respiratory rate 18, temperature 97.8, weight 86 kg, saturation of  oxygen 97% on room air.  GENERAL:  He was in no acute distress, very pleasant man, physically  fit.  HEART:  Regular rate and rhythm.  No murmurs, rubs or gallops.  LUNGS: Clear to auscultation bilaterally.  ABDOMEN:  Soft, nontender, and nondistended with positive bowel sounds.  NECK:  Supple with no adenopathies.  HEENT:  Pupils equal and reactive to light and accommodation.  EXTREMITIES:  On his lower extremities, he had tenderness and swelling  of the left calf, approximately 3 inches bigger than the right calf, and  the  swelling extended up to mid thigh as well as the pain.  He had no  palpable cords.  No erythema or warmth.  There was no signs of trauma  like bruises and no open wounds.   LABS:  PT 13.4, INR 1.0, PTT 29, sodium 138, potassium 3.8, chloride  101, CO2 28, glucose 108, BUN 16, creatinine 1.0, bilirubin 1.1,  alkaline phosphatase 87, AST 24, ALT 17 protein 7.4, albumin 3.9,  calcium 9.5.  WBC 7.8, hemoglobin 16.3, MCV 88.3, platelets 156.  Homocysteine level was 11.5.   HOSPITAL COURSE:  1. New-onset DVT.  So far, it seems like this DVT is idiopathic.  The      fact that it happened most likely unprovoked in a young man of 55      years old is worrisome for a hypercoagulable state.  The      recommendations were to check for protein C and protein S      deficiencies as well as antithrombin III deficiency, protein C      function, factor V Leyden and this is all part of the      hypercoagulable panel which was checked and is pending at the time      of discharge.  Another potential worry is a possible connection      with the esophageal ulcer adding to the importance of the final      pathological diagnosis of this lesion. He will be treated with      Lovenox 80 mg subcutaneously twice a day and was started on      Coumadin 10 mg on the day of admission and then will take 5 mg      daily.  He will come back to the outpatient clinic on October 09, 2007, for an INR check and the results will be faxed to Dr.      Dellia Beckwith for Coumadin adjustments if needed.  Thereafter,      he will be set up with Dr. Juleen Starr in the outpatient clinic for      Coumadin management.  He will stay on Lovenox until his INR has      been therapeutic for at least 2 days.  2. Esophageal ulcer.  As stated above, this was diagnosed by EGD two  days prior to admission secondary to an episode of choking prior to      the EGD.  He was asymptomatic otherwise from the ulcer.  This is      obviously  worrisome for some type of malignancy like Barrett      esophagus or with esophageal cancer, although he has never smoked      and is a non-drinker.  We will get records from Dr. Loreta Ave when he      follows up in the outpatient clinic to see the results of the      biopsy.  Otherwise, continue on Nexium daily and sucralfate four      times a day.   DISCHARGE LABS AND VITALS:  WBC 6.6, hemoglobin 13.8, platelets 141.  PTT 41.  Sodium 138, potassium 3.9, chloride 106, CO2 27, glucose 89,  BUN 15, creatinine 1.0, calcium 8.8, PT 13.8 and INR 1.0.      Dellia Beckwith, M.D.  Electronically Signed      Madaline Guthrie, M.D.  Electronically Signed    VD/MEDQ  D:  10/06/2007  T:  10/07/2007  Job:  [id number]

## 2011-04-26 NOTE — Discharge Summary (Signed)
[name], [name]               ACCOUNT NO.:  [id number]   MEDICAL RECORD NO.:  [id number]          PATIENT TYPE:  INP   LOCATION:  5734                         FACILITY:  [facility]   PHYSICIAN:  Dellia Beckwith, M.D. DATE OF BIRTH:  [birth year]   DATE OF ADMISSION:  10/05/2007  DATE OF DISCHARGE:  10/08/2007                               DISCHARGE SUMMARY   ADDENDUM   On October 06, 2007, before discharge it was noted that the patient's  hemoglobin had decreased from 16.3 to 13.8.  With his history of  esophageal ulcer, we were worried that Lovenox treatment was making him  bleed.  We ordered fecal occult blood test that was negative x3 and his  hemoglobin stayed stable just 14.3 on the day of discharge.  His INR on  the day of discharge was 2.1, 1.5 on the day prior to discharge.  His  hypercoagulable is still pending and he will follow up with Dr.  Lonni Fix in the outpatient clinic on November 10 who should  follow up on these results, and he will also follow up with Dr. Chancy Milroy in the in the outpatient clinic on October 31.  He will be on  Coumadin 5 mg p.o. daily until then as well as Lovenox 80 mg  subcutaneously twice a day until then.  Dr. Alexandria Lodge will then adjust the  dosage of his Coumadin appropriately.  Robert [name] will be off of work the  rest of this week; next week he will be doing desk work only and then  resume normal activities a week after that.   DISCHARGE LABS AND VITALS:  Temperature 98.5, blood pressure 114/70,  heart rate 70, respiratory rate 18, saturation 98% on room air.      Dellia Beckwith, M.D.  Electronically Signed     VD/MEDQ  D:  10/08/2007  T:  10/08/2007  Job:  [id number]

## 2011-04-29 NOTE — Op Note (Signed)
. [facility]  Patient:    GUNNISON, CHAHAL Visit Number: [id number] MRN: [id number]          Service Type: Attending:  Barron Alvine, M.D. Dictated by:   Barron Alvine, M.D. Proc. Date: 03/27/00                             Operative Report  PREOPERATIVE DIAGNOSIS:  Infertility, status post vasectomy.  POSTOPERATIVE DIAGNOSIS:  Infertility, status post vasectomy.  PROCEDURE:  Microscopic vasovasostomy.  SURGEON:  Barron Alvine, M.D.  ASSISTANT:  Veverly Fells. Vernie Ammons, M.D.  ANESTHESIA:  General.  INDICATIONS:  Mr. Trompeter is a 55 year old male.  He had a vasectomy approximately three years prior to this procedure.  He was interested in reversal.  The patient underwent extensive counseling with regard to the options.  We talked about the cost as well as the success rate of the procedure.  The patient also underwent counseling with regard to other potential options for this couples infertility.  He opted to have a microscopic vasovasostomy.  DESCRIPTION OF PROCEDURE:  The patient was brought to the operating room.  We had successful induction of general anesthesia.  He was placed in the supine position, prepped and draped in the usual manner.  Because it has been 18 months since this procedure and the original dictation was lost, I cannot remember all the specifics.  We do generally enter the hemiscrotum and find the site of the previous vasectomy.  The vasa are dissected back proximally and distally until healthy vasal segments are identified.  We generally transect the vas utilizing a nerve holder with a special Beaver blade to get a clean transection.  Proximal aspect of the vas is usually dilated.  The operative microscope is then brought in.  Touch preps are done, which were done in this case and positive for semen on both sides.  We checked for patency of the vas both proximally and distally, although with excretion of fluid we know that the  proximal aspect was patent.  The anastomosis itself was done with the operative microscope.  A modified one-layer anastomosis was performed utilizing 9-0 suture.  This was done with standard microscopic techniques.  In both cases for Mr. Boehning, we saw positive semen on the touch preps.  We do not recall any difficulty in performing the anastomosis.  They were done separately with the assistance of Dr. Vernie Ammons.  Approximately six to eight sutures were placed in each side.  The anastomosis itself again was done with the operative microscope utilizing vas reapproximators to be sure there was no tension.  At completion there was no tension on either anastomosis. The scrotum itself was closed in a standard manner with interrupted Vicryl, with Vicryl on the skin.  Of note, in the interim since the patient has had this procedure, he recovered well.  His initial semen analysis showed a total count of 240 million.  He subsequently called in June 2001 and reported that his wife was pregnant.  We do not have any information about whether that pregnancy was carried successfully or not. Dictated by:   Barron Alvine, M.D. Attending:  Barron Alvine, M.D. DD:  08/21/01 TD:  08/21/01 Job: [id number]

## 2011-09-21 LAB — LUPUS ANTICOAGULANT PANEL
Lupus Anticoagulant: NOT DETECTED
PTT Lupus Anticoagulant: 48.5 (ref 36.3–48.8)

## 2011-09-21 LAB — OCCULT BLOOD X 1 CARD TO LAB, STOOL: Fecal Occult Bld: NEGATIVE

## 2011-09-21 LAB — CBC
HCT: 39.8
HCT: 41.5
HCT: 41.7
HCT: 42.1
HCT: 48.1
Hemoglobin: 13.8
Hemoglobin: 14.2
Hemoglobin: 14.3
Hemoglobin: 14.3
MCHC: 33.8
MCHC: 33.9
MCHC: 34.3
MCHC: 34.4
MCHC: 34.6
MCV: 88
MCV: 88.3
Platelets: 145 — ABNORMAL LOW
Platelets: 156
RBC: 4.63
RBC: 4.72
RBC: 4.75
RBC: 5.45
RDW: 13.7
RDW: 13.8
RDW: 14
RDW: 14.1 — ABNORMAL HIGH
WBC: 7.8

## 2011-09-21 LAB — BASIC METABOLIC PANEL
BUN: 10
BUN: 15
CO2: 26
CO2: 27
Chloride: 106
GFR calc non Af Amer: >60
Glucose, Bld: 100 — ABNORMAL HIGH
Glucose, Bld: 89
Potassium: 3.9
Potassium: 3.9

## 2011-09-21 LAB — DIFFERENTIAL
Basophils Absolute: 0.1
Lymphocytes Relative: 19
Lymphs Abs: 1.5
Neutro Abs: 5.4

## 2011-09-21 LAB — COMPREHENSIVE METABOLIC PANEL
BUN: 16
CO2: 28
Calcium: 9.5
Chloride: 101
Creatinine, Ser: 1.01
GFR calc non Af Amer: >60
Total Bilirubin: 1.1

## 2011-09-21 LAB — FACTOR 5 LEIDEN

## 2011-09-21 LAB — PROTIME-INR
INR: 1
INR: 2.1 — ABNORMAL HIGH
Prothrombin Time: 13.4

## 2011-09-21 LAB — PROTEIN S ACTIVITY: Protein S Activity: 120 % (ref 69–129)

## 2011-09-21 LAB — HOMOCYSTEINE: Homocysteine: 11.5

## 2011-09-21 LAB — CARDIOLIPIN ANTIBODIES, IGG, IGM, IGA: Anticardiolipin IgG: 9 — ABNORMAL LOW (ref <11)

## 2011-09-21 LAB — APTT: aPTT: 29

## 2012-03-07 ENCOUNTER — Other Ambulatory Visit: Payer: Self-pay | Admitting: Gastroenterology

## 2012-03-14 ENCOUNTER — Ambulatory Visit
Admission: RE | Admit: 2012-03-14 | Discharge: 2012-03-14 | Disposition: A | Discharge status: Home / Self Care | Payer: 59 | Source: Ambulatory Visit | Attending: Gastroenterology | Admitting: Gastroenterology

## 2014-01-16 ENCOUNTER — Encounter: Payer: Self-pay | Admitting: General Surgery

## 2014-01-16 DIAGNOSIS — R0602 Shortness of breath: Secondary | ICD-10-CM

## 2014-05-26 ENCOUNTER — Other Ambulatory Visit (HOSPITAL_COMMUNITY): Payer: 59

## 2014-05-27 ENCOUNTER — Encounter (HOSPITAL_COMMUNITY): Payer: Self-pay | Admitting: Cardiology

## 2014-06-16 ENCOUNTER — Encounter (HOSPITAL_COMMUNITY): Payer: Self-pay | Admitting: Cardiology

## 2014-06-18 ENCOUNTER — Other Ambulatory Visit: Payer: Self-pay | Admitting: General Surgery

## 2014-06-18 DIAGNOSIS — I7789 Other specified disorders of arteries and arterioles: Secondary | ICD-10-CM

## 2014-06-25 ENCOUNTER — Ambulatory Visit (HOSPITAL_COMMUNITY): Payer: 59 | Attending: Cardiovascular Disease | Admitting: Radiology

## 2014-06-25 DIAGNOSIS — I7789 Other specified disorders of arteries and arterioles: Secondary | ICD-10-CM | POA: Insufficient documentation

## 2014-06-25 DIAGNOSIS — Z86718 Personal history of other venous thrombosis and embolism: Secondary | ICD-10-CM | POA: Insufficient documentation

## 2014-06-25 DIAGNOSIS — Z8249 Family history of ischemic heart disease and other diseases of the circulatory system: Secondary | ICD-10-CM | POA: Insufficient documentation

## 2014-06-25 NOTE — Progress Notes (Signed)
Echocardiogram performed.  

## 2014-06-27 ENCOUNTER — Telehealth: Payer: Self-pay | Admitting: *Deleted

## 2014-06-27 DIAGNOSIS — R0602 Shortness of breath: Secondary | ICD-10-CM

## 2014-06-27 NOTE — Telephone Encounter (Signed)
pt has been been notified about echo results with verbal understanding, will repeat echo in 1 year

## 2015-06-30 ENCOUNTER — Institutional Professional Consult (permissible substitution): Payer: Self-pay | Admitting: Internal Medicine

## 2015-07-03 ENCOUNTER — Other Ambulatory Visit (INDEPENDENT_AMBULATORY_CARE_PROVIDER_SITE_OTHER): Payer: Commercial Managed Care - HMO

## 2015-07-03 ENCOUNTER — Ambulatory Visit (INDEPENDENT_AMBULATORY_CARE_PROVIDER_SITE_OTHER)
Admission: RE | Admit: 2015-07-03 | Discharge: 2015-07-03 | Disposition: A | Discharge status: Home / Self Care | Payer: Commercial Managed Care - HMO | Source: Ambulatory Visit | Attending: Internal Medicine | Admitting: Internal Medicine

## 2015-07-03 ENCOUNTER — Encounter: Payer: Self-pay | Admitting: Internal Medicine

## 2015-07-03 ENCOUNTER — Ambulatory Visit (INDEPENDENT_AMBULATORY_CARE_PROVIDER_SITE_OTHER): Payer: Commercial Managed Care - HMO | Admitting: Internal Medicine

## 2015-07-03 ENCOUNTER — Encounter (INDEPENDENT_AMBULATORY_CARE_PROVIDER_SITE_OTHER): Payer: Self-pay

## 2015-07-03 VITALS — BP 118/82 | HR 73 | Ht 74.0 in | Wt 225.8 lb

## 2015-07-03 DIAGNOSIS — R05 Cough: Secondary | ICD-10-CM

## 2015-07-03 DIAGNOSIS — R058 Other specified cough: Secondary | ICD-10-CM

## 2015-07-03 LAB — CBC WITH DIFFERENTIAL/PLATELET
BASOS ABS: 0 10*3/uL (ref 0.0–0.1)
BASOS PCT: 0.5 % (ref 0.0–3.0)
EOS ABS: 0.2 10*3/uL (ref 0.0–0.7)
EOS PCT: 3.1 % (ref 0.0–5.0)
HEMATOCRIT: 47.8 % (ref 39.0–52.0)
Hemoglobin: 16.3 g/dL (ref 13.0–17.0)
Lymphocytes Relative: 23 % (ref 12.0–46.0)
Lymphs Abs: 1.4 10*3/uL (ref 0.7–4.0)
MCHC: 34 g/dL (ref 30.0–36.0)
MCV: 87.5 fl (ref 78.0–100.0)
MONO ABS: 0.5 10*3/uL (ref 0.1–1.0)
Monocytes Relative: 8.2 % (ref 3.0–12.0)
NEUTROS ABS: 4 10*3/uL (ref 1.4–7.7)
Neutrophils Relative %: 65.2 % (ref 43.0–77.0)
Platelets: 169 10*3/uL (ref 150.0–400.0)
RBC: 5.46 Mil/uL (ref 4.22–5.81)
RDW: 13.6 % (ref 11.5–15.5)
WBC: 6.1 10*3/uL (ref 4.0–10.5)

## 2015-07-03 MED ORDER — FAMOTIDINE 20 MG PO TABS: ORAL_TABLET | ORAL | Status: AC

## 2015-07-03 NOTE — Patient Instructions (Signed)
dexilant 60 mg Take 30-60 min before first meal of the day and pepcid 20 mg one at hs   GERD (REFLUX)  is an extremely common cause of respiratory symptoms just like yours , many times with no obvious heartburn at all.    It can be treated with medication, but also with lifestyle changes including elevation of the head of your bed (ideally with 6 inch  bed blocks),  Smoking cessation, avoidance of late meals, excessive alcohol, and avoid fatty foods, chocolate, peppermint, colas, red wine, and acidic juices such as orange juice.  NO MINT OR MENTHOL PRODUCTS SO NO COUGH DROPS  USE SUGARLESS CANDY INSTEAD (Jolley ranchers or Stover's or Life Savers) or even ice chips will also do - the key is to swallow to prevent all throat clearing. NO OIL BASED VITAMINS - use powdered substitutes.  Please remember to go to the lab and x-ray department downstairs for your tests - we will call you with the results when they are available.  Please see patient coordinator before you leave today  to schedule sinus CT   Please schedule a follow up office visit in 4 weeks, sooner if needed

## 2015-07-03 NOTE — Progress Notes (Signed)
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  07/03/2015 1st [name]/ [name]   Chief Complaint  Patient presents with  . Pulmonary Consult    Referred by Dr Melinda Crutch. Pt c/o cough for the past 3-4 yrs- prod with dark yellow sputum. He states cough is not triggered by anything he has noticed.   indolent onset persistent daily x 4 y cough day > noct but  freq  worse first thing in am after stirring   No obvious patterns in day to day or daytime variability or assoc sob or cp or chest tightness, subjective wheeze . No unusual exp hx or h/o childhood pna/ asthma or knowledge of premature birth.  Sleeping ok without nocturnal  or early am exacerbation  of respiratory  c/o's or need for noct saba. Also denies any obvious fluctuation of symptoms with weather or environmental changes or other aggravating or alleviating factors except as outlined above   Current Medications, Allergies, Complete Past Medical History, Past Surgical History, Family History, and Social History were reviewed in [employer] record.      Review of Systems  Constitutional: Negative for fever, chills, activity change, appetite change and unexpected weight change.  HENT: Negative for congestion, dental problem, postnasal drip, rhinorrhea, sneezing, sore throat, trouble swallowing and voice change.   Eyes: Negative for visual disturbance.  Respiratory: Positive for cough. Negative for choking and shortness of breath.     Cardiovascular: Negative for chest pain and leg swelling.  Gastrointestinal: Negative for nausea, vomiting and abdominal pain.  Genitourinary: Negative for difficulty urinating.       Heartburn  Musculoskeletal: Negative for arthralgias.  Skin: Negative for rash.  Psychiatric/Behavioral: Negative for behavioral problems and confusion.       Objective:   Physical Exam  amb wm nad  Wt Readings from Last 3 Encounters:  07/03/15 225 lb 12.8 oz (102.422 kg)  11/24/10 197 lb 0.2 oz (89.364 kg)  04/02/09 199 lb 11.2 oz (90.583 kg)    Vital signs reviewed  HEENT: nl dentition, turbinates, and orophanx. Nl external ear canals without cough reflex   NECK :  without JVD/Nodes/TM/ nl carotid upstrokes bilaterally   LUNGS: no acc muscle use, clear to A and P bilaterally with urge to cough early on insp  maneuvers   CV:  RRR  no s3 or murmur or increase in P2, no edema   ABD:  soft and nontender with nl excursion in the supine position. No bruits or organomegaly, bowel sounds nl  MS:  warm without deformities, calf tenderness, cyanosis or clubbing  SKIN: warm and dry without lesions    NEURO:  alert, approp, no deficits     I personally reviewed images and agree with radiology impression as follows:  CXR:  07/03/15 The heart size and mediastinal contours are within normal limits. Both lungs are clear. No pneumothorax or pleural effusion is noted. The visualized skeletal structures are unremarkable.  Labs ordered 07/03/15 allergy profile / cbc with diff       Assessment & Plan:

## 2015-07-04 ENCOUNTER — Encounter: Payer: Self-pay | Admitting: Internal Medicine

## 2015-07-05 ENCOUNTER — Encounter: Payer: Self-pay | Admitting: Internal Medicine

## 2015-07-05 NOTE — Assessment & Plan Note (Addendum)
-   Dg Esopagram 03/14/2012  1. Small hiatal hernia. 2. Significant spontaneous gastroesophageal reflux. - allergy profile 07/03/2015 >   Eos 0.2   The most common causes of chronic cough in immunocompetent adults include the following: upper airway cough syndrome (UACS), previously referred to as postnasal drip syndrome (PNDS), which is caused by variety of rhinosinus conditions; (2) asthma; (3) GERD; (4) chronic bronchitis from cigarette smoking or other inhaled environmental irritants; (5) nonasthmatic eosinophilic bronchitis; and (6) bronchiectasis.   These conditions, singly or in combination, have accounted for up to 94% of the causes of chronic cough in prospective studies.   Other conditions have constituted no >6% of the causes in prospective studies These have included bronchogenic carcinoma, chronic interstitial pneumonia, sarcoidosis, left ventricular failure, ACEI-induced cough, and aspiration from a condition associated with pharyngeal dysfunction.    Chronic cough is often simultaneously caused by more than one condition. A single cause has been found from 38 to 82% of the time, multiple causes from 18 to 62%. Multiply caused cough has been the result of three diseases up to 42% of the time.       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   These are the same pts (now being labeled as having "irritable larynx syndrome" by some cough centers) who not infrequently have a history of having failed to tolerate ace inhibitors,  dry  powder inhalers or biphosphonates or report having atypical reflux symptoms that don't respond to standard doses of PPI , and are easily confused as having aecopd or asthma flares by even experienced allergists/ pulmonologists.   The first step is to maximize acid suppression and dietary/lifestyle changes and evaluate for allergy/sinus dz (which may be the trigger or spark but not the "fuel" for the fire)  Each maintenance medication was reviewed in detail including most importantly the difference between maintenance and as needed and under what circumstances the prns are to be used.  Please see instructions for details which were reviewed in writing and the patient given a copy.

## 2015-07-06 LAB — ALLERGY FULL PROFILE
ALLERGEN, D PTERNOYSSINUS, D1: 0.15 kU/L — AB
ALTERNARIA ALTERNATA: 0.16 kU/L — AB
ASPERGILLUS FUMIGATUS M3: 0.11 kU/L — AB
Box Elder IgE: <0.1 kU/L
Candida Albicans: <0.1 kU/L
Curvularia lunata: 0.14 kU/L — ABNORMAL HIGH
D. FARINAE: 0.15 kU/L — AB
Dog Dander: <0.1 kU/L
Fescue: <0.1 kU/L
G005 Rye, Perennial: <0.1 kU/L
G009 Red Top: <0.1 kU/L
HELMINTHOSPORIUM HALODES: 0.16 kU/L — AB
House Dust Hollister: <0.1 kU/L
IgE (Immunoglobulin E), Serum: 96 kU/L (ref <115)
Oak: <0.1 kU/L
STEMPHYLIUM BOTRYOSUM: 0.13 kU/L — AB
Timothy Grass: <0.1 kU/L

## 2015-07-06 NOTE — Progress Notes (Signed)
Quick Note:  Spoke with pt and notified of results per Dr. [name]. Pt verbalized understanding and denied any questions.  ______ 

## 2015-07-17 ENCOUNTER — Ambulatory Visit (INDEPENDENT_AMBULATORY_CARE_PROVIDER_SITE_OTHER)
Admission: RE | Admit: 2015-07-17 | Discharge: 2015-07-17 | Disposition: A | Discharge status: Home / Self Care | Payer: Commercial Managed Care - HMO | Source: Ambulatory Visit | Attending: Internal Medicine | Admitting: Internal Medicine

## 2015-07-17 DIAGNOSIS — R058 Other specified cough: Secondary | ICD-10-CM

## 2015-07-17 DIAGNOSIS — R05 Cough: Secondary | ICD-10-CM

## 2015-07-20 ENCOUNTER — Other Ambulatory Visit: Payer: Self-pay | Admitting: Internal Medicine

## 2015-07-20 DIAGNOSIS — R05 Cough: Secondary | ICD-10-CM

## 2015-07-20 DIAGNOSIS — R058 Other specified cough: Secondary | ICD-10-CM

## 2015-07-20 NOTE — Progress Notes (Signed)
Quick Note:  Spoke with pt and notified of results per Dr. [name]. Pt verbalized understanding and denied any questions.  ______ 

## 2015-08-04 ENCOUNTER — Ambulatory Visit: Payer: Commercial Managed Care - HMO | Admitting: Internal Medicine

## 2016-12-26 DIAGNOSIS — Z23 Encounter for immunization: Secondary | ICD-10-CM | POA: Diagnosis not present

## 2017-01-02 DIAGNOSIS — J069 Acute upper respiratory infection, unspecified: Secondary | ICD-10-CM | POA: Diagnosis not present

## 2017-01-02 DIAGNOSIS — R05 Cough: Secondary | ICD-10-CM | POA: Diagnosis not present

## 2017-01-04 DIAGNOSIS — J069 Acute upper respiratory infection, unspecified: Secondary | ICD-10-CM | POA: Diagnosis not present

## 2017-01-06 DIAGNOSIS — R972 Elevated prostate specific antigen [PSA]: Secondary | ICD-10-CM | POA: Diagnosis not present

## 2017-01-24 DIAGNOSIS — L570 Actinic keratosis: Secondary | ICD-10-CM | POA: Diagnosis not present

## 2017-02-04 IMAGING — CT CT PARANASAL SINUSES LIMITED
1 of 2 series · 16 of 19 positions shown, 20 images · non-contrast
Comparison: None available.

CLINICAL DATA: Upper airway cough syndrome. Productive cough for a
few months.

EXAM:
CT PARANASAL SINUS LIMITED WITHOUT CONTRAST
TECHNIQUE: Non-contiguous multidetector CT images of the paranasal sinuses were
obtained in a single plane without contrast.

[Series 4: ltd sinus 3.0 h30s · axial · 0.36mm/px · z∈[-78,+17]mm · 16 of 18 slices shown, 20 images]
[im 2/18  brain]
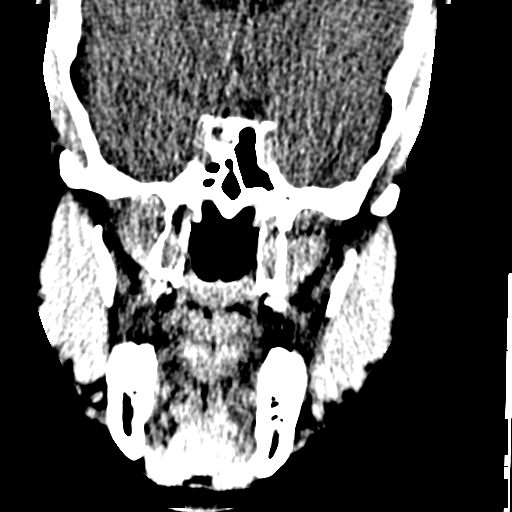
[im 2/18  bone]
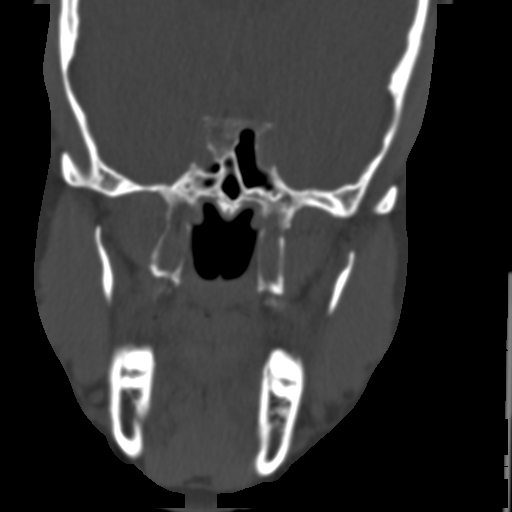
[im 3/18  bone]
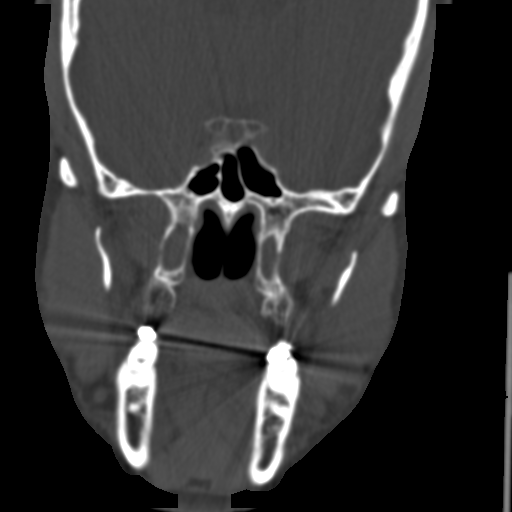
[im 4/18  bone]
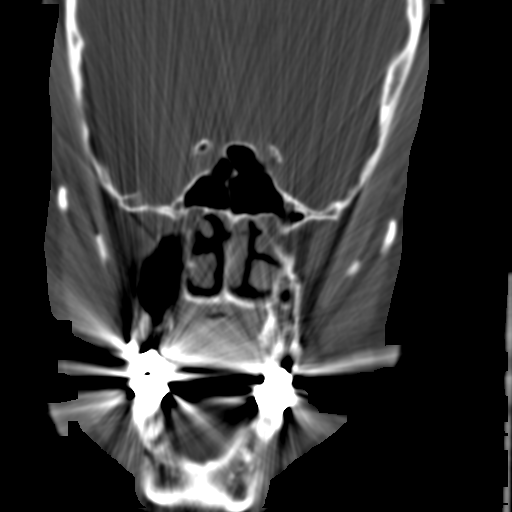
[im 5/18  bone]
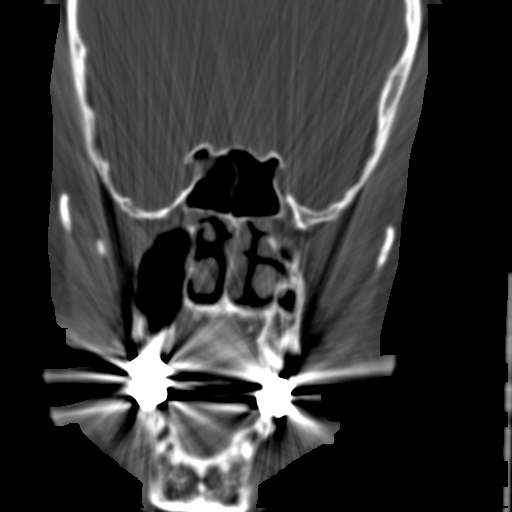
[im 6/18  brain]
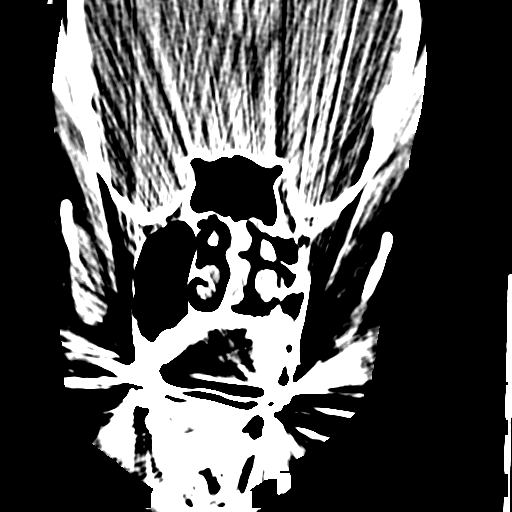
[im 6/18  bone]
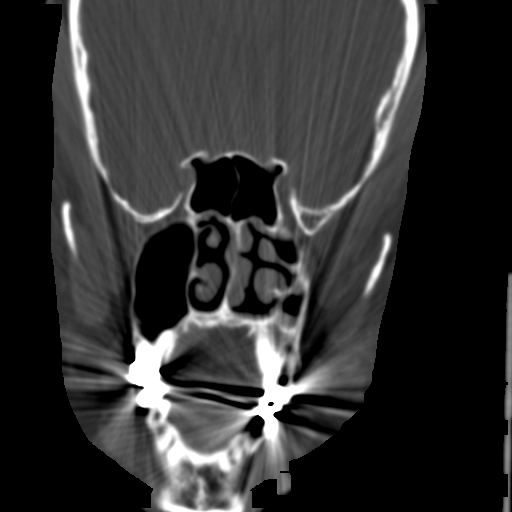
[im 7/18  bone]
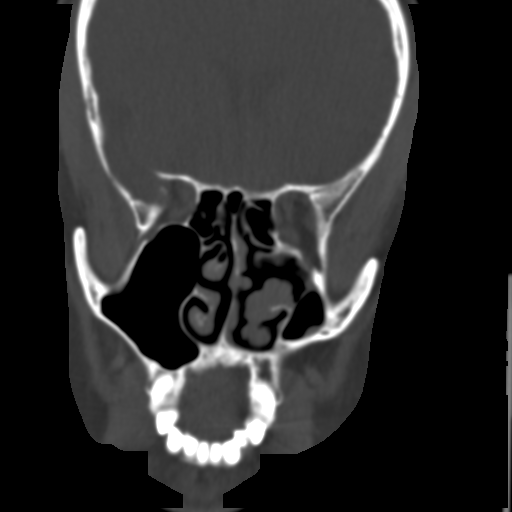
[im 8/18  bone]
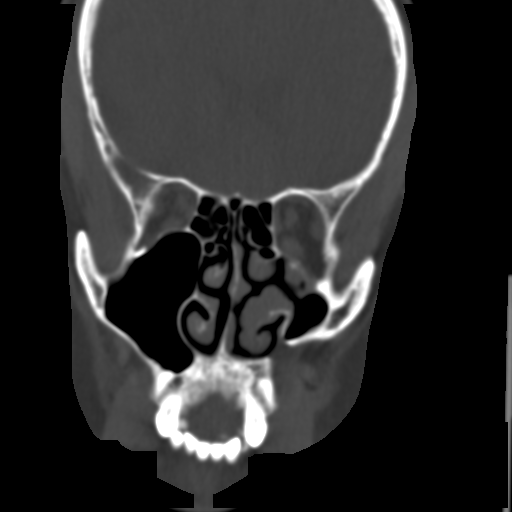
[im 9/18  bone]
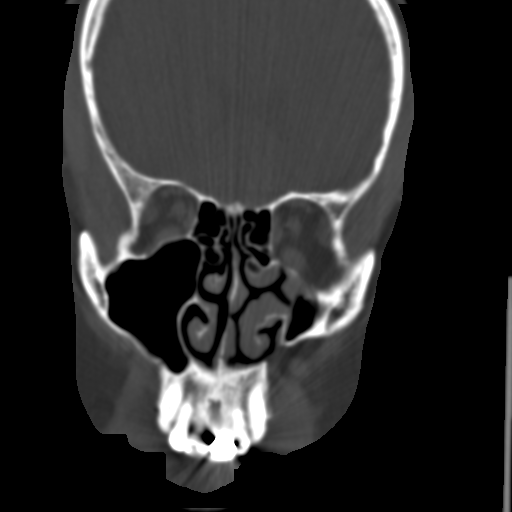
[im 10/18  brain]
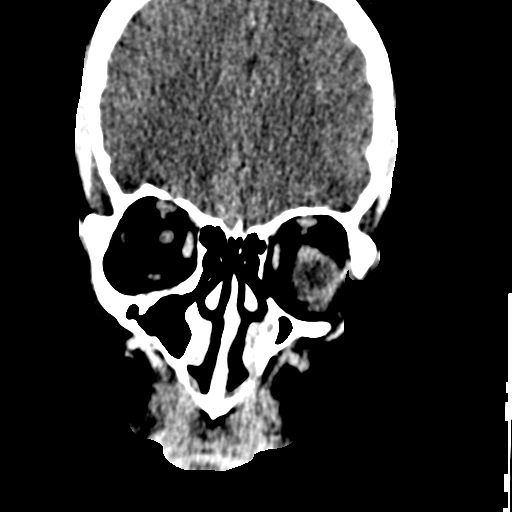
[im 10/18  bone]
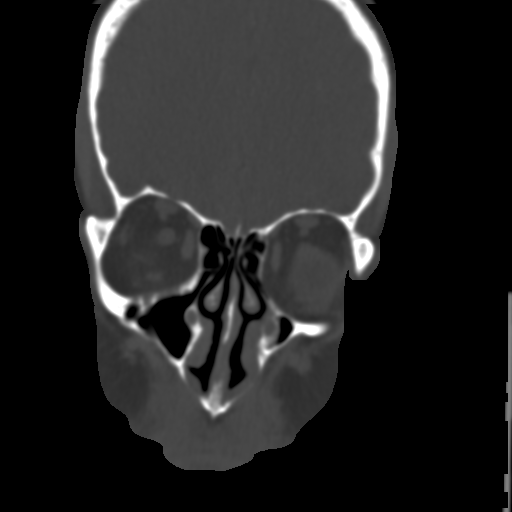
[im 11/18  bone]
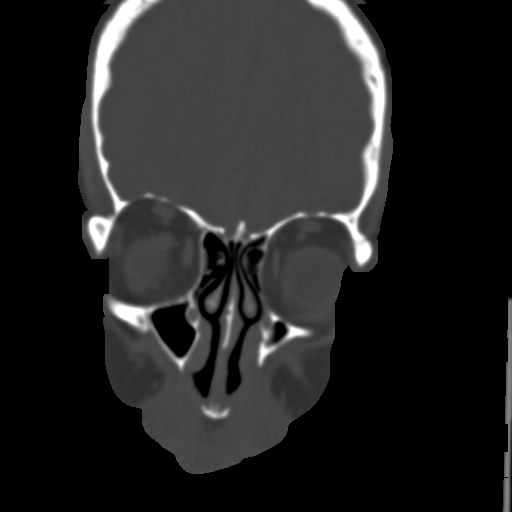
[im 12/18  bone]
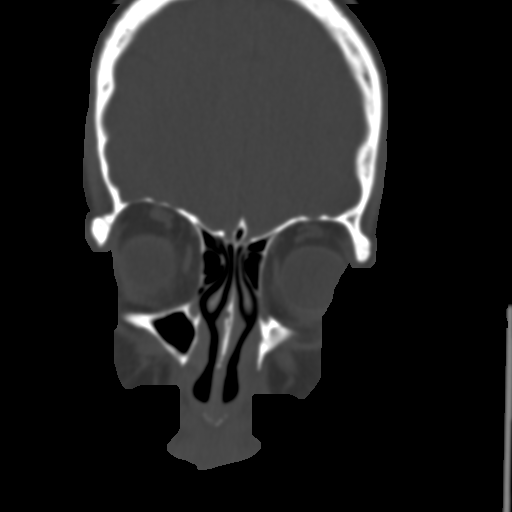
[im 13/18  bone]
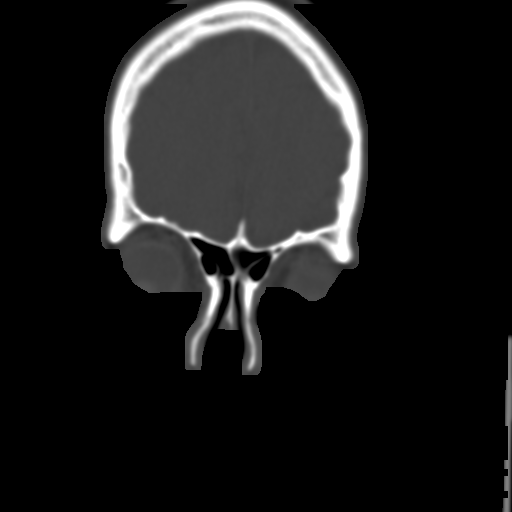
[im 14/18  brain]
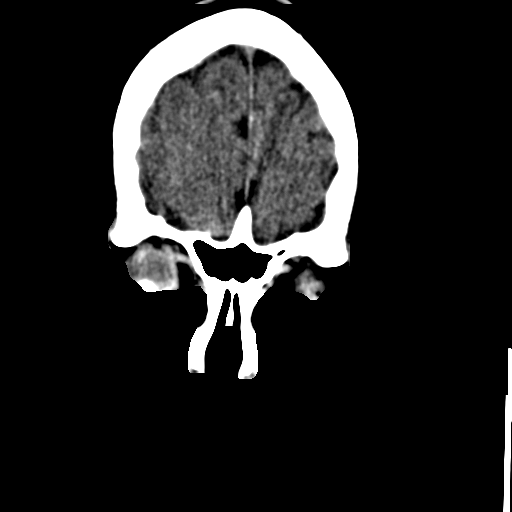
[im 14/18  bone]
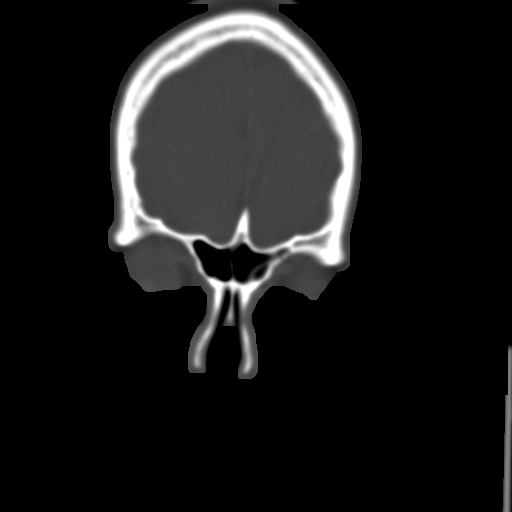
[im 15/18  bone]
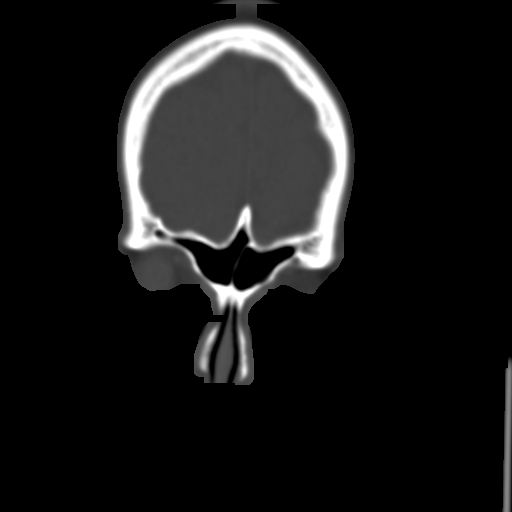
[im 16/18  bone]
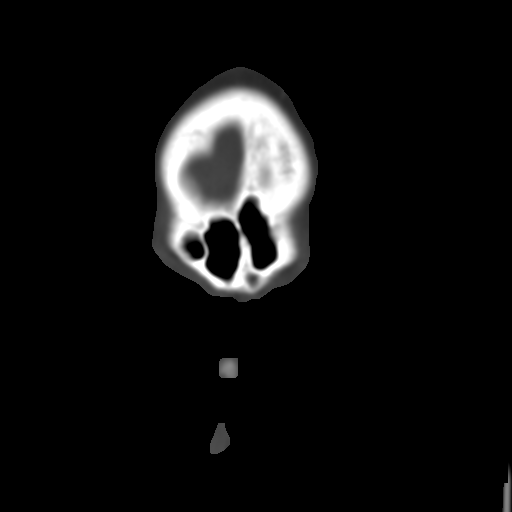
[im 17/18  bone]
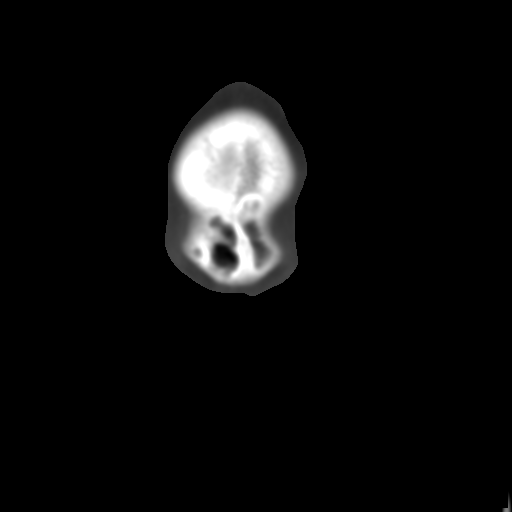

[16 of 19 positions shown; findings below may reference images not displayed]

FINDINGS: Screening examination demonstrates a somewhat nodular appearance of
the nasal mucosa it, particularly on the left. The left maxillary
sinuses shrunken. The ostiomeatal complex is patent on the left.
There may have been surgery. The remaining paranasal sinuses are
clear.

Limited imaging of the brain is unremarkable.
IMPRESSION: 1. Shrunken left maxillary sinus suggesting chronic disease.
2. Nodular appearance of the mucosa within the left nasal cavity
suggesting polyposis.

## 2017-02-14 DIAGNOSIS — R978 Other abnormal tumor markers: Secondary | ICD-10-CM | POA: Diagnosis not present

## 2017-02-14 DIAGNOSIS — N289 Disorder of kidney and ureter, unspecified: Secondary | ICD-10-CM | POA: Diagnosis not present

## 2017-02-14 DIAGNOSIS — G4700 Insomnia, unspecified: Secondary | ICD-10-CM | POA: Diagnosis not present

## 2017-03-07 DIAGNOSIS — R972 Elevated prostate specific antigen [PSA]: Secondary | ICD-10-CM | POA: Diagnosis not present

## 2017-06-13 DIAGNOSIS — M5134 Other intervertebral disc degeneration, thoracic region: Secondary | ICD-10-CM | POA: Diagnosis not present

## 2017-06-13 DIAGNOSIS — M546 Pain in thoracic spine: Secondary | ICD-10-CM | POA: Diagnosis not present

## 2017-08-03 DIAGNOSIS — Z0000 Encounter for general adult medical examination without abnormal findings: Secondary | ICD-10-CM | POA: Diagnosis not present

## 2017-08-03 DIAGNOSIS — Z23 Encounter for immunization: Secondary | ICD-10-CM | POA: Diagnosis not present

## 2017-08-30 DIAGNOSIS — R972 Elevated prostate specific antigen [PSA]: Secondary | ICD-10-CM | POA: Diagnosis not present

## 2018-01-04 DIAGNOSIS — J029 Acute pharyngitis, unspecified: Secondary | ICD-10-CM | POA: Diagnosis not present

## 2018-01-15 DIAGNOSIS — Z23 Encounter for immunization: Secondary | ICD-10-CM | POA: Diagnosis not present

## 2018-01-15 DIAGNOSIS — L570 Actinic keratosis: Secondary | ICD-10-CM | POA: Diagnosis not present

## 2018-01-15 DIAGNOSIS — D485 Neoplasm of uncertain behavior of skin: Secondary | ICD-10-CM | POA: Diagnosis not present

## 2018-01-18 DIAGNOSIS — C44329 Squamous cell carcinoma of skin of other parts of face: Secondary | ICD-10-CM | POA: Diagnosis not present

## 2018-01-25 DIAGNOSIS — N401 Enlarged prostate with lower urinary tract symptoms: Secondary | ICD-10-CM | POA: Diagnosis not present

## 2018-01-29 DIAGNOSIS — N401 Enlarged prostate with lower urinary tract symptoms: Secondary | ICD-10-CM | POA: Diagnosis not present

## 2018-02-05 DIAGNOSIS — R8271 Bacteriuria: Secondary | ICD-10-CM | POA: Diagnosis not present

## 2018-02-05 DIAGNOSIS — N401 Enlarged prostate with lower urinary tract symptoms: Secondary | ICD-10-CM | POA: Diagnosis not present

## 2018-02-05 DIAGNOSIS — R310 Gross hematuria: Secondary | ICD-10-CM | POA: Diagnosis not present

## 2018-02-06 DIAGNOSIS — R319 Hematuria, unspecified: Secondary | ICD-10-CM | POA: Diagnosis not present

## 2018-02-06 DIAGNOSIS — G4700 Insomnia, unspecified: Secondary | ICD-10-CM | POA: Diagnosis not present

## 2018-02-12 DIAGNOSIS — N200 Calculus of kidney: Secondary | ICD-10-CM | POA: Diagnosis not present

## 2018-02-12 DIAGNOSIS — R310 Gross hematuria: Secondary | ICD-10-CM | POA: Diagnosis not present

## 2018-02-16 DIAGNOSIS — N401 Enlarged prostate with lower urinary tract symptoms: Secondary | ICD-10-CM | POA: Diagnosis not present

## 2018-02-16 DIAGNOSIS — R310 Gross hematuria: Secondary | ICD-10-CM | POA: Diagnosis not present

## 2018-05-24 DIAGNOSIS — S83241A Other tear of medial meniscus, current injury, right knee, initial encounter: Secondary | ICD-10-CM | POA: Diagnosis not present

## 2018-07-17 DIAGNOSIS — H00022 Hordeolum internum right lower eyelid: Secondary | ICD-10-CM | POA: Diagnosis not present

## 2018-08-08 DIAGNOSIS — L821 Other seborrheic keratosis: Secondary | ICD-10-CM | POA: Diagnosis not present

## 2018-08-08 DIAGNOSIS — L570 Actinic keratosis: Secondary | ICD-10-CM | POA: Diagnosis not present

## 2018-09-17 DIAGNOSIS — Z0000 Encounter for general adult medical examination without abnormal findings: Secondary | ICD-10-CM | POA: Diagnosis not present

## 2018-10-08 DIAGNOSIS — L570 Actinic keratosis: Secondary | ICD-10-CM | POA: Diagnosis not present

## 2019-02-20 DIAGNOSIS — R350 Frequency of micturition: Secondary | ICD-10-CM | POA: Diagnosis not present

## 2019-02-20 DIAGNOSIS — N401 Enlarged prostate with lower urinary tract symptoms: Secondary | ICD-10-CM | POA: Diagnosis not present

## 2019-02-20 DIAGNOSIS — R8271 Bacteriuria: Secondary | ICD-10-CM | POA: Diagnosis not present

## 2019-02-20 DIAGNOSIS — R351 Nocturia: Secondary | ICD-10-CM | POA: Diagnosis not present

## 2019-03-06 DIAGNOSIS — Z86718 Personal history of other venous thrombosis and embolism: Secondary | ICD-10-CM | POA: Diagnosis not present

## 2019-03-06 DIAGNOSIS — N183 Chronic kidney disease, stage 3 (moderate): Secondary | ICD-10-CM | POA: Diagnosis not present

## 2019-03-06 DIAGNOSIS — G4700 Insomnia, unspecified: Secondary | ICD-10-CM | POA: Diagnosis not present

## 2019-03-12 DIAGNOSIS — N401 Enlarged prostate with lower urinary tract symptoms: Secondary | ICD-10-CM | POA: Diagnosis not present

## 2019-03-12 DIAGNOSIS — R351 Nocturia: Secondary | ICD-10-CM | POA: Diagnosis not present

## 2019-03-15 DIAGNOSIS — R972 Elevated prostate specific antigen [PSA]: Secondary | ICD-10-CM | POA: Diagnosis not present

## 2019-03-15 DIAGNOSIS — N401 Enlarged prostate with lower urinary tract symptoms: Secondary | ICD-10-CM | POA: Diagnosis not present

## 2019-03-15 DIAGNOSIS — R351 Nocturia: Secondary | ICD-10-CM | POA: Diagnosis not present

## 2020-01-28 ENCOUNTER — Ambulatory Visit: Payer: 59 | Attending: Internal Medicine

## 2020-01-28 DIAGNOSIS — Z20822 Contact with and (suspected) exposure to covid-19: Secondary | ICD-10-CM

## 2020-01-29 LAB — NOVEL CORONAVIRUS, NAA: SARS-CoV-2, NAA: NOT DETECTED

## 2020-12-24 ENCOUNTER — Other Ambulatory Visit: Payer: Self-pay

## 2020-12-24 ENCOUNTER — Other Ambulatory Visit: Payer: Self-pay | Admitting: Nurse Practitioner

## 2020-12-24 ENCOUNTER — Ambulatory Visit
Admission: RE | Admit: 2020-12-24 | Discharge: 2020-12-24 | Disposition: A | Discharge status: Home / Self Care | Payer: No Typology Code available for payment source | Source: Ambulatory Visit | Attending: Nurse Practitioner | Admitting: Nurse Practitioner

## 2020-12-24 DIAGNOSIS — Z0289 Encounter for other administrative examinations: Secondary | ICD-10-CM

## 2022-07-15 IMAGING — DX DG CHEST 2V
2 series · 2 of 2 positions shown · non-contrast
Comparison: July 03, 2015.

CLINICAL DATA: Fire exit exam.

EXAM:
CHEST - 2 VIEW

[dg chest 2 view (1 of 2)]
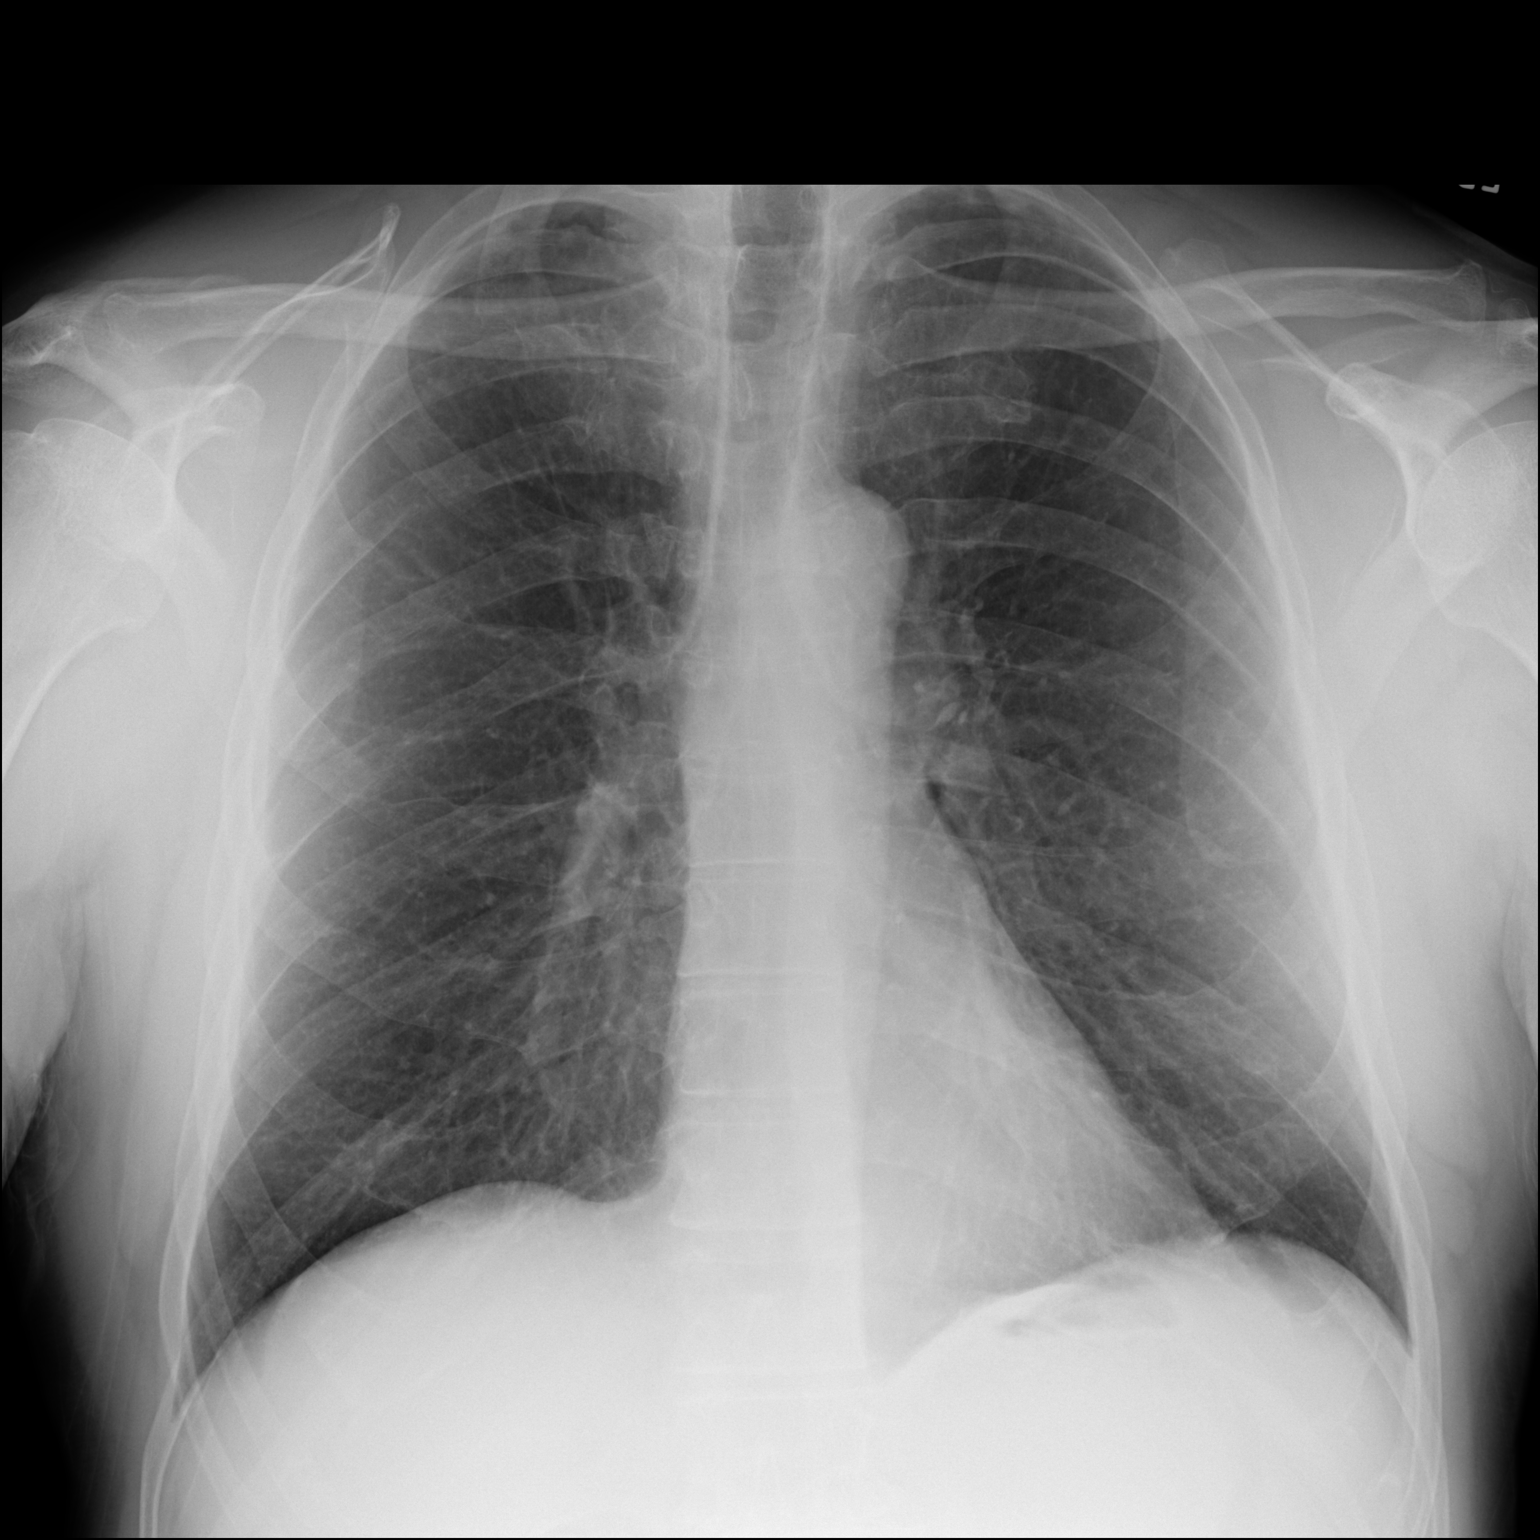

[dg chest 2 view (2 of 2)]
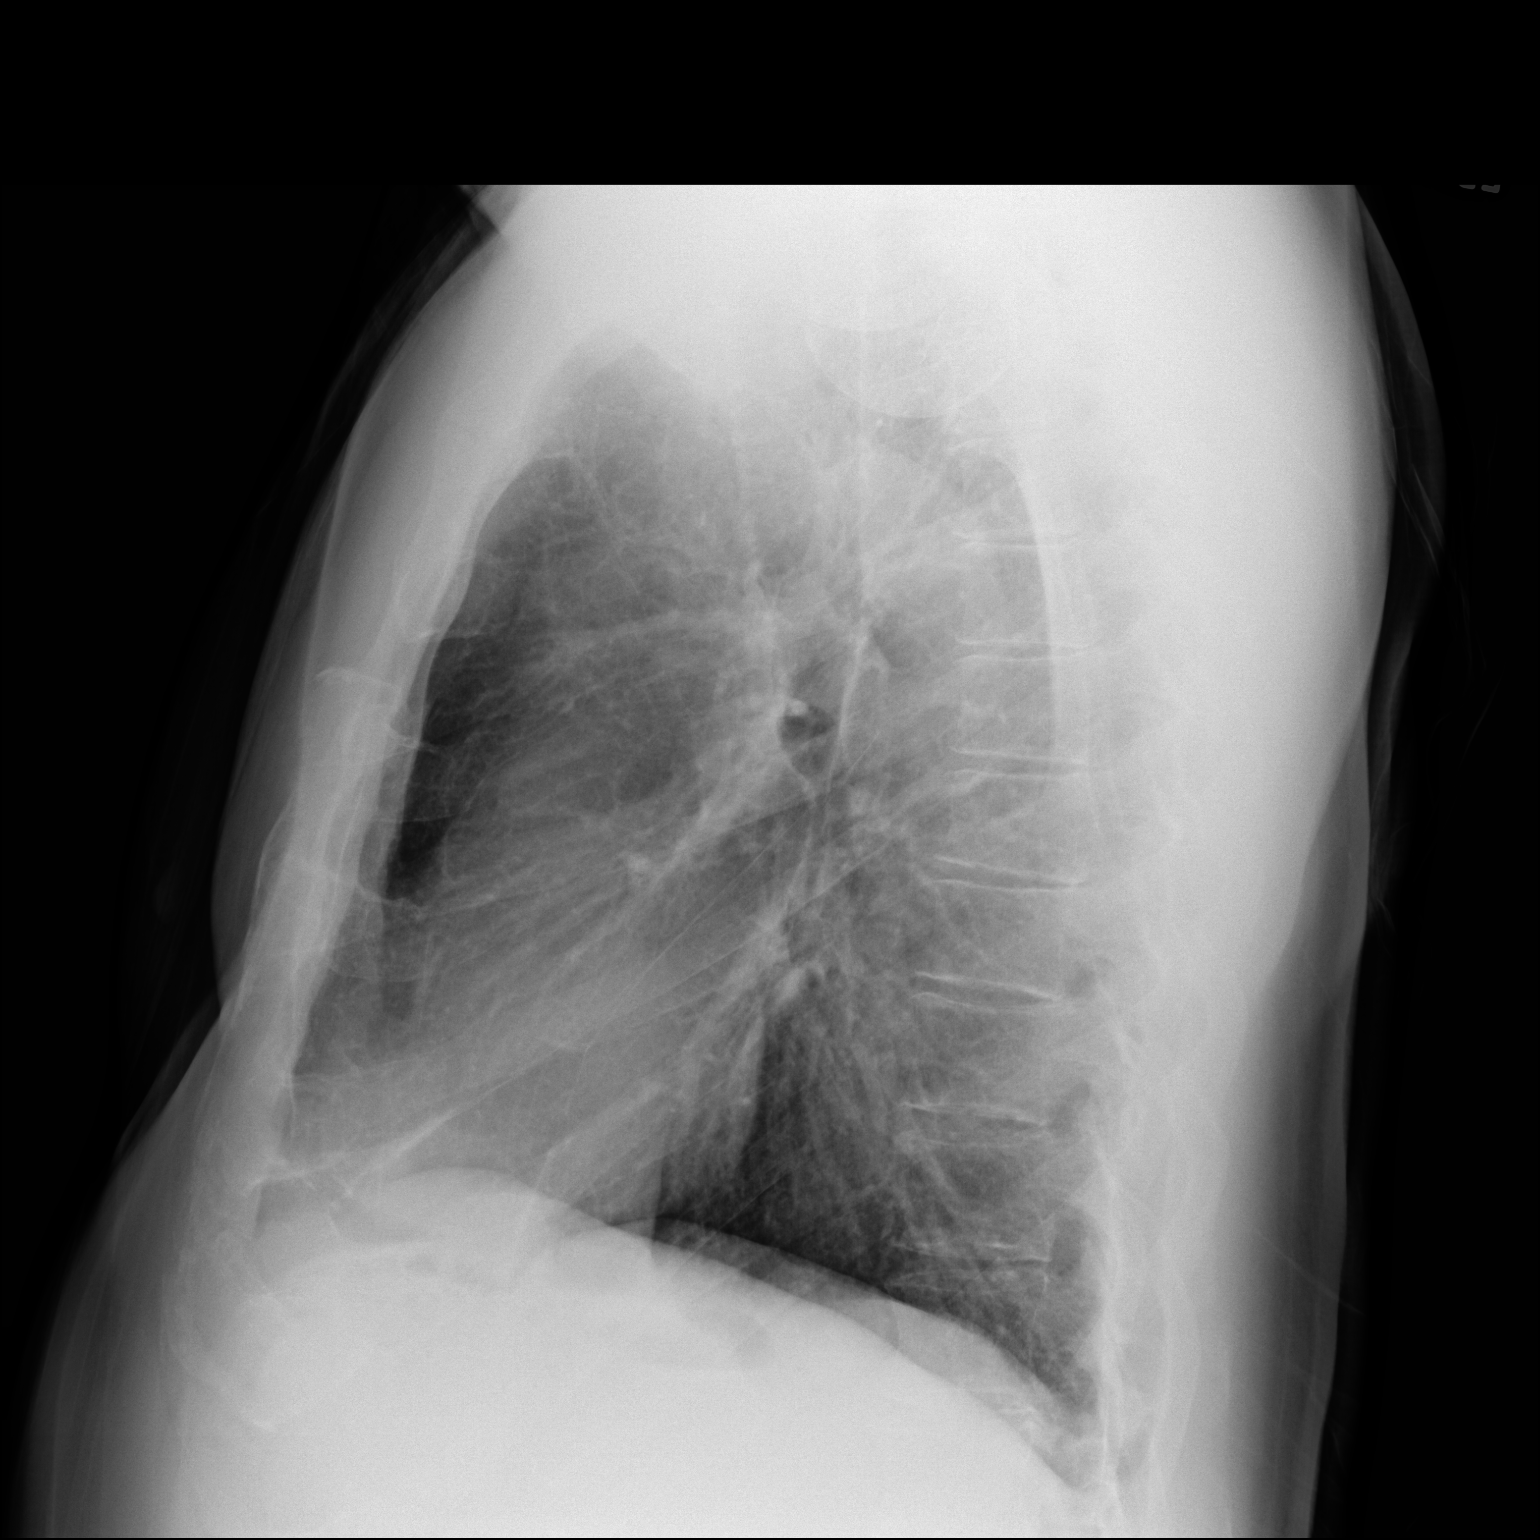

[2 of 2 positions shown; findings below may reference images not displayed]

FINDINGS: The heart size and mediastinal contours are within normal limits.
Both lungs are clear. The visualized skeletal structures are
unremarkable.
IMPRESSION: No active cardiopulmonary disease.
